# Patient Record
Sex: Female | Born: 1990 | Race: Black or African American | Hispanic: No | Marital: Single | Smoking: Never smoker
Health system: Southern US, Community
[De-identification: ages and names within clinical notes are randomized; demographics above are authoritative.]

## PROBLEM LIST (undated history)

## (undated) DIAGNOSIS — R519 Headache, unspecified: Secondary | ICD-10-CM

## (undated) DIAGNOSIS — R51 Headache: Secondary | ICD-10-CM

## (undated) DIAGNOSIS — F419 Anxiety disorder, unspecified: Secondary | ICD-10-CM

## (undated) HISTORY — DX: Headache, unspecified: R51.9

## (undated) HISTORY — PX: WISDOM TOOTH EXTRACTION: SHX21

## (undated) HISTORY — DX: Headache: R51

---
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
## 2011-09-08 ENCOUNTER — Other Ambulatory Visit: Payer: Self-pay | Admitting: Gastroenterology

## 2011-09-08 DIAGNOSIS — R1110 Vomiting, unspecified: Secondary | ICD-10-CM

## 2011-09-08 DIAGNOSIS — R109 Unspecified abdominal pain: Secondary | ICD-10-CM

## 2011-09-16 ENCOUNTER — Encounter (HOSPITAL_COMMUNITY)
Admission: RE | Admit: 2011-09-16 | Discharge: 2011-09-16 | Disposition: A | Discharge status: Home / Self Care | Payer: BC Managed Care – PPO | Source: Ambulatory Visit | Attending: Gastroenterology | Admitting: Gastroenterology

## 2011-09-16 DIAGNOSIS — R1110 Vomiting, unspecified: Secondary | ICD-10-CM | POA: Insufficient documentation

## 2011-09-16 DIAGNOSIS — R109 Unspecified abdominal pain: Secondary | ICD-10-CM | POA: Insufficient documentation

## 2011-09-16 MED ORDER — SINCALIDE 5 MCG IJ SOLR
INTRAMUSCULAR | Status: AC
  Administered 2011-09-16: 3.48 ug via INTRAVENOUS
  Filled 2011-09-16: qty 5

## 2011-09-16 MED ORDER — TECHNETIUM TC 99M MEBROFENIN IV KIT
5.0000 | PACK | Freq: Once | INTRAVENOUS | Status: AC | PRN
  Administered 2011-09-16: 5 via INTRAVENOUS

## 2011-09-16 NOTE — Progress Notes (Deleted)
Pt in for hepatobiliary scan with kinevac.   Pt's wt today:  153 LB / 69.5 KG Kinevac doasage:  3.5 mcg diluted in 250 ML NS.  100 ML infused at 200 ML per hour. Pt tolerated well

## 2011-09-16 NOTE — Progress Notes (Signed)
Pt for hepatobiliary scan with kinevac today. Wt 153 lb / 69.5 KG Kinevac dosage of 3.5 mcg added to 250 ml NS.  Infused 100 ml at rate of 200 ml per hr. Pt tolerated well.

## 2013-05-02 ENCOUNTER — Ambulatory Visit (INDEPENDENT_AMBULATORY_CARE_PROVIDER_SITE_OTHER): Payer: BC Managed Care – PPO | Admitting: Emergency Medicine

## 2013-05-02 VITALS — BP 118/72 | HR 93 | Temp 99.0°F | Resp 16 | Ht 64.5 in | Wt 162.6 lb

## 2013-05-02 DIAGNOSIS — G8929 Other chronic pain: Secondary | ICD-10-CM

## 2013-05-02 DIAGNOSIS — R1013 Epigastric pain: Secondary | ICD-10-CM

## 2013-05-02 DIAGNOSIS — M549 Dorsalgia, unspecified: Secondary | ICD-10-CM

## 2013-05-02 LAB — COMPREHENSIVE METABOLIC PANEL WITH GFR
ALT: 11 U/L (ref 0–35)
AST: 22 U/L (ref 0–37)
Albumin: 4.6 g/dL (ref 3.5–5.2)
Alkaline Phosphatase: 84 U/L (ref 39–117)
BUN: 7 mg/dL (ref 6–23)
CO2: 25 meq/L (ref 19–32)
Calcium: 10 mg/dL (ref 8.4–10.5)
Chloride: 102 meq/L (ref 96–112)
Creat: 0.59 mg/dL (ref 0.50–1.10)
Glucose, Bld: 77 mg/dL (ref 70–99)
Potassium: 4.3 meq/L (ref 3.5–5.3)
Sodium: 138 meq/L (ref 135–145)
Total Bilirubin: 0.3 mg/dL (ref 0.3–1.2)
Total Protein: 8 g/dL (ref 6.0–8.3)

## 2013-05-02 LAB — POCT URINALYSIS DIPSTICK
Bilirubin, UA: NEGATIVE
Blood, UA: NEGATIVE
Glucose, UA: NEGATIVE
Leukocytes, UA: NEGATIVE
Nitrite, UA: NEGATIVE
Protein, UA: NEGATIVE
Urobilinogen, UA: 0.2
pH, UA: 8.5

## 2013-05-02 LAB — POCT CBC
Granulocyte percent: 48.5 % (ref 37–80)
HCT, POC: 41 % (ref 37.7–47.9)
Hemoglobin: 12.8 g/dL (ref 12.2–16.2)
Lymph, poc: 3.1 (ref 0.6–3.4)
MCH, POC: 27.2 pg (ref 27–31.2)
MCHC: 31.2 g/dL — AB (ref 31.8–35.4)
MCV: 87 fL (ref 80–97)
MID (cbc): 0.6 (ref 0–0.9)
MPV: 9.4 fL (ref 0–99.8)
POC Granulocyte: 3.4 (ref 2–6.9)
POC LYMPH PERCENT: 43.7 % (ref 10–50)
POC MID %: 7.8 % (ref 0–12)
Platelet Count, POC: 229 10*3/uL (ref 142–424)
RBC: 4.71 M/uL (ref 4.04–5.48)
RDW, POC: 14.5 %
WBC: 7.1 10*3/uL (ref 4.6–10.2)

## 2013-05-02 LAB — LIPASE: Lipase: 13 U/L (ref 0–75)

## 2013-05-02 LAB — POCT UA - MICROSCOPIC ONLY
Bacteria, U Microscopic: NEGATIVE
Crystals, Ur, HPF, POC: NEGATIVE

## 2013-05-02 LAB — POCT URINE PREGNANCY: Preg Test, Ur: NEGATIVE

## 2013-05-02 LAB — AMYLASE: Amylase: 71 U/L (ref 0–105)

## 2013-05-02 MED ORDER — SUCRALFATE 1 GM/10ML PO SUSP: 1.0000 g | Freq: Four times a day (QID) | ORAL | Status: DC

## 2013-05-02 MED ORDER — OMEPRAZOLE 40 MG PO CPDR: 40.0000 mg | DELAYED_RELEASE_CAPSULE | Freq: Every day | ORAL | Status: DC

## 2013-05-02 NOTE — Patient Instructions (Addendum)
Abdominal Pain    Your appt with the GI doctor is scheduled on Oct 7th at 1:45 with Dr Loreta Ave     Abdominal pain can be caused by many things. Your caregiver decides the seriousness of your pain by an examination and possibly blood tests and X-rays. Many cases can be observed and treated at home. Most abdominal pain is not caused by a disease and will probably improve without treatment. However, in many cases, more time must pass before a clear cause of the pain can be found. Before that point, it may not be known if you need more testing, or if hospitalization or surgery is needed. HOME CARE INSTRUCTIONS   Do not take laxatives unless directed by your caregiver.  Take pain medicine only as directed by your caregiver.  Only take over-the-counter or prescription medicines for pain, discomfort, or fever as directed by your caregiver.  Try a clear liquid diet (broth, tea, or water) for as long as directed by your caregiver. Slowly move to a bland diet as tolerated. SEEK IMMEDIATE MEDICAL CARE IF:   The pain does not go away.  You have a fever.  You keep throwing up (vomiting).  The pain is felt only in portions of the abdomen. Pain in the right side could possibly be appendicitis. In an adult, pain in the left lower portion of the abdomen could be colitis or diverticulitis.  You pass bloody or black tarry stools. MAKE SURE YOU:   Understand these instructions.  Will watch your condition.  Will get help right away if you are not doing well or get worse. Document Released: 04/27/2005 Document Revised: 10/10/2011 Document Reviewed: 03/05/2008 [facility] Patient Information 2014 [place], [place].

## 2013-05-02 NOTE — Progress Notes (Signed)
Subjective:    Patient ID: Tracy Murphy, female    DOB: 1991-04-16, 22 y.o.   MRN: [id number]  Back Pain The current episode started 1 to 4 weeks ago. The problem has been gradually worsening since onset. The pain is present in the lumbar spine. The quality of the pain is described as aching and stabbing. The pain is at a severity of 8/10. The pain is moderate (back is the worst pain that radiates to the sides of her abd). The pain is the same all the time. The symptoms are aggravated by sitting. Associated symptoms include abdominal pain and headaches. Pertinent negatives include no fever. She has tried NSAIDs for the symptoms. The treatment provided mild relief.  Abdominal Pain Associated symptoms include headaches, nausea and vomiting. Pertinent negatives include no diarrhea or fever.  Emesis  Associated symptoms include abdominal pain and headaches. Pertinent negatives include no chills, diarrhea or fever.   Patient comes into our office with low back pain that's been going on for 1 week haven't been around anyone sick and haven't been out of the country No HX of back pain Last menstrual period was last month on the 15th Not sexually active, not married and no children; Student at [employer] On an antibiotic for gum infection on right side bottom gum she started this medicine last Saturday also pain medicine that she started the same day   Review of Systems  Constitutional: Positive for fatigue. Negative for fever and chills.  HENT: Negative for ear pain.   Gastrointestinal: Positive for nausea, vomiting and abdominal pain. Negative for diarrhea.       Upper abd pain  Genitourinary: Positive for urgency.  Musculoskeletal: Positive for back pain.  Neurological: Positive for headaches.       Sometimes off/on       Objective:   Physical Exam patient is alert and cooperative she is not in any distress. The clear to auscultation and percussion heart regular rate no murmurs or gallops  appreciated. Abdomen is flat there is significant mid epigastric tenderness to. bowel sounds are present.there is no rebound.   Results for orders placed in visit on 05/02/13  POCT URINALYSIS DIPSTICK      Result Value Range   Color, UA yellow     Clarity, UA clear     Glucose, UA neg     Bilirubin, UA neg     Ketones, UA neg     Spec Grav, UA 1.020     Blood, UA neg     pH, UA 8.5     Protein, UA neg     Urobilinogen, UA 0.2     Nitrite, UA neg     Leukocytes, UA Negative    POCT UA - MICROSCOPIC ONLY      Result Value Range   WBC, Ur, HPF, POC 0-4     RBC, urine, microscopic 0-3     Bacteria, U Microscopic neg     Mucus, UA neg     Epithelial cells, urine per micros 9-17     Crystals, Ur, HPF, POC neg     Casts, Ur, LPF, POC neg     Yeast, UA neg     Results for orders placed in visit on 05/02/13  POCT URINALYSIS DIPSTICK      Result Value Range   Color, UA yellow     Clarity, UA clear     Glucose, UA neg     Bilirubin, UA neg  Ketones, UA neg     Spec Grav, UA 1.020     Blood, UA neg     pH, UA 8.5     Protein, UA neg     Urobilinogen, UA 0.2     Nitrite, UA neg     Leukocytes, UA Negative    POCT UA - MICROSCOPIC ONLY      Result Value Range   WBC, Ur, HPF, POC 0-4     RBC, urine, microscopic 0-3     Bacteria, U Microscopic neg     Mucus, UA neg     Epithelial cells, urine per micros 9-17     Crystals, Ur, HPF, POC neg     Casts, Ur, LPF, POC neg     Yeast, UA neg    POCT CBC      Result Value Range   WBC 7.1  4.6 - 10.2 K/uL   Lymph, poc 3.1  0.6 - 3.4   POC LYMPH PERCENT 43.7  10 - 50 %L   MID (cbc) 0.6  0 - 0.9   POC MID % 7.8  0 - 12 %M   POC Granulocyte 3.4  2 - 6.9   Granulocyte percent 48.5  37 - 80 %G   RBC 4.71  4.04 - 5.48 M/uL   Hemoglobin 12.8  12.2 - 16.2 g/dL   HCT, POC 69.6  29.5 - 47.9 %   MCV 87.0  80 - 97 fL   MCH, POC 27.2  27 - 31.2 pg   MCHC 31.2 (*) 31.8 - 35.4 g/dL   RDW, POC 28.4     Platelet Count, POC 229  142 - 424  K/uL   MPV 9.4  0 - 99.8 fL  POCT URINE PREGNANCY      Result Value Range   Preg Test, Ur Negative         Assessment & Plan:  Appointment made with Dr. Loreta Ave. We will treat with Carafate and Prilosec prior to appointment. She was given a liter of fluids IV and encouraged during good quantities of fluid

## 2013-05-03 LAB — HELICOBACTER PYLORI  ANTIBODY, IGM: Helicobacter pylori, IgM: 4.2 U/mL (ref <9.0)

## 2013-09-02 ENCOUNTER — Ambulatory Visit (INDEPENDENT_AMBULATORY_CARE_PROVIDER_SITE_OTHER): Payer: BC Managed Care – PPO | Admitting: Family Medicine

## 2013-09-02 VITALS — BP 122/90 | HR 87 | Temp 98.0°F | Resp 16 | Ht 64.0 in | Wt 164.0 lb

## 2013-09-02 DIAGNOSIS — R112 Nausea with vomiting, unspecified: Secondary | ICD-10-CM

## 2013-09-02 DIAGNOSIS — G43119 Migraine with aura, intractable, without status migrainosus: Secondary | ICD-10-CM

## 2013-09-02 DIAGNOSIS — Z79899 Other long term (current) drug therapy: Secondary | ICD-10-CM

## 2013-09-02 MED ORDER — PROMETHAZINE HCL 25 MG PO TABS: 25.0000 mg | ORAL_TABLET | Freq: Three times a day (TID) | ORAL | Status: DC | PRN

## 2013-09-02 MED ORDER — BUTALBITAL-APAP-CAFFEINE 50-325-40 MG PO TABS: 1.0000 | ORAL_TABLET | Freq: Three times a day (TID) | ORAL | Status: DC | PRN

## 2013-09-02 MED ORDER — KETOROLAC TROMETHAMINE 60 MG/2ML IM SOLN
60.0000 mg | Freq: Once | INTRAMUSCULAR | Status: AC
  Administered 2013-09-02: 60 mg via INTRAMUSCULAR

## 2013-09-02 NOTE — Patient Instructions (Signed)
Migraine Headache A migraine headache is an intense, throbbing pain on one or both sides of your head. A migraine can last for 30 minutes to several hours. CAUSES  The exact cause of a migraine headache is not always known. However, a migraine may be caused when nerves in the brain become irritated and release chemicals that cause inflammation. This causes pain. Certain things may also trigger migraines, such as:  Alcohol.  Smoking.  Stress.  Menstruation.  Aged cheeses.  Foods or drinks that contain nitrates, glutamate, aspartame, or tyramine.  Lack of sleep.  Chocolate.  Caffeine.  Hunger.  Physical exertion.  Fatigue.  Medicines used to treat chest pain (nitroglycerine), birth control pills, estrogen, and some blood pressure medicines. SIGNS AND SYMPTOMS  Pain on one or both sides of your head.  Pulsating or throbbing pain.  Severe pain that prevents daily activities.  Pain that is aggravated by any physical activity.  Nausea, vomiting, or both.  Dizziness.  Pain with exposure to bright lights, loud noises, or activity.  General sensitivity to bright lights, loud noises, or smells. Before you get a migraine, you may get warning signs that a migraine is coming (aura). An aura may include:  Seeing flashing lights.  Seeing bright spots, halos, or zig-zag lines.  Having tunnel vision or blurred vision.  Having feelings of numbness or tingling.  Having trouble talking.  Having muscle weakness. DIAGNOSIS  A migraine headache is often diagnosed based on:  Symptoms.  Physical exam.  A CT scan or MRI of your head. These imaging tests cannot diagnose migraines, but they can help rule out other causes of headaches. TREATMENT Medicines may be given for pain and nausea. Medicines can also be given to help prevent recurrent migraines.  HOME CARE INSTRUCTIONS  Only take over-the-counter or prescription medicines for pain or discomfort as directed by your  health care provider. The use of long-term narcotics is not recommended.  Lie down in a dark, quiet room when you have a migraine.  Keep a journal to find out what may trigger your migraine headaches. For example, write down:  What you eat and drink.  How much sleep you get.  Any change to your diet or medicines.  Limit alcohol consumption.  Quit smoking if you smoke.  Get 7 9 hours of sleep, or as recommended by your health care provider.  Limit stress.  Keep lights dim if bright lights bother you and make your migraines worse. SEEK IMMEDIATE MEDICAL CARE IF:   Your migraine becomes severe.  You have a fever.  You have a stiff neck.  You have vision loss.  You have muscular weakness or loss of muscle control.  You start losing your balance or have trouble walking.  You feel faint or pass out.  You have severe symptoms that are different from your first symptoms. MAKE SURE YOU:   Understand these instructions.  Will watch your condition.  Will get help right away if you are not doing well or get worse. Document Released: 07/18/2005 Document Revised: 05/08/2013 Document Reviewed: 03/25/2013 [employer] Patient Information 2014 [employer].  
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
## 2013-09-20 ENCOUNTER — Emergency Department (HOSPITAL_COMMUNITY)
Admission: EM | Admit: 2013-09-20 | Discharge: 2013-09-20 | Disposition: A | Discharge status: Home / Self Care | Payer: BC Managed Care – PPO | Attending: Emergency Medicine | Admitting: Emergency Medicine

## 2013-09-20 ENCOUNTER — Encounter (HOSPITAL_COMMUNITY): Payer: Self-pay | Admitting: Emergency Medicine

## 2013-09-20 ENCOUNTER — Emergency Department (HOSPITAL_COMMUNITY): Payer: BC Managed Care – PPO

## 2013-09-20 DIAGNOSIS — Y9389 Activity, other specified: Secondary | ICD-10-CM | POA: Insufficient documentation

## 2013-09-20 DIAGNOSIS — Z8679 Personal history of other diseases of the circulatory system: Secondary | ICD-10-CM | POA: Insufficient documentation

## 2013-09-20 DIAGNOSIS — S5002XA Contusion of left elbow, initial encounter: Secondary | ICD-10-CM

## 2013-09-20 DIAGNOSIS — S5000XA Contusion of unspecified elbow, initial encounter: Secondary | ICD-10-CM | POA: Insufficient documentation

## 2013-09-20 DIAGNOSIS — S161XXA Strain of muscle, fascia and tendon at neck level, initial encounter: Secondary | ICD-10-CM

## 2013-09-20 DIAGNOSIS — Y92410 Unspecified street and highway as the place of occurrence of the external cause: Secondary | ICD-10-CM | POA: Insufficient documentation

## 2013-09-20 DIAGNOSIS — S139XXA Sprain of joints and ligaments of unspecified parts of neck, initial encounter: Secondary | ICD-10-CM | POA: Insufficient documentation

## 2013-09-20 MED ORDER — HYDROCODONE-ACETAMINOPHEN 5-325 MG PO TABS
1.0000 | ORAL_TABLET | Freq: Once | ORAL | Status: AC
Start: 2013-09-20 — End: 2013-09-20
  Administered 2013-09-20: 1 via ORAL
  Filled 2013-09-20: qty 1

## 2013-09-20 MED ORDER — IBUPROFEN 800 MG PO TABS
800.0000 mg | ORAL_TABLET | Freq: Once | ORAL | Status: AC
  Administered 2013-09-20: 800 mg via ORAL
  Filled 2013-09-20: qty 1

## 2013-09-20 MED ORDER — HYDROCODONE-ACETAMINOPHEN 5-325 MG PO TABS: 1.0000 | ORAL_TABLET | Freq: Four times a day (QID) | ORAL | Status: DC | PRN

## 2013-09-20 MED ORDER — IBUPROFEN 800 MG PO TABS: 800.0000 mg | ORAL_TABLET | Freq: Three times a day (TID) | ORAL | Status: DC | PRN

## 2013-09-20 NOTE — Discharge Instructions (Signed)
Return here as needed. Your x-rays were normal. Follow up with your doctor and the orthopedist provided. Ice and heat to the areas that are sore.

## 2013-09-20 NOTE — ED Provider Notes (Signed)
Medical screening examination/treatment/procedure(s) were performed by non-physician practitioner and as supervising physician I was immediately available for consultation/collaboration.  EKG Interpretation   None         Rolan BuccoMelanie Jaydan Meidinger, MD 09/20/13 1727
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
## 2013-10-11 ENCOUNTER — Ambulatory Visit (INDEPENDENT_AMBULATORY_CARE_PROVIDER_SITE_OTHER): Payer: BC Managed Care – PPO | Admitting: Physician Assistant

## 2013-10-11 VITALS — BP 104/62 | HR 87 | Temp 98.7°F | Resp 18 | Ht 65.0 in | Wt 165.0 lb

## 2013-10-11 DIAGNOSIS — G43909 Migraine, unspecified, not intractable, without status migrainosus: Secondary | ICD-10-CM | POA: Insufficient documentation

## 2013-10-11 DIAGNOSIS — M546 Pain in thoracic spine: Secondary | ICD-10-CM

## 2013-10-11 MED ORDER — CYCLOBENZAPRINE HCL 10 MG PO TABS: 10.0000 mg | ORAL_TABLET | Freq: Every day | ORAL | Status: DC

## 2013-10-11 NOTE — Patient Instructions (Signed)
Re-try the ibuprofen, 3 times each day, with food. If it still feels too strong, try breaking it in half. Take the muscle relaxer (cyclobenzaprine) at bedtime-it causes sleepiness. Apply a warm compress to the area 3 or more times each day, for 15-30 minutes. After applying the warm compress, try some gentle range of motion of the shoulder and elbow. [facility], we'll plan to get you scheduled for physical therapy.
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
## 2014-02-08 ENCOUNTER — Emergency Department (HOSPITAL_COMMUNITY): Payer: BC Managed Care – PPO

## 2014-02-08 ENCOUNTER — Emergency Department (HOSPITAL_COMMUNITY)
Admission: EM | Admit: 2014-02-08 | Discharge: 2014-02-09 | Disposition: A | Discharge status: Home / Self Care | Payer: BC Managed Care – PPO | Attending: Emergency Medicine | Admitting: Emergency Medicine

## 2014-02-08 ENCOUNTER — Encounter (HOSPITAL_COMMUNITY): Payer: Self-pay | Admitting: Emergency Medicine

## 2014-02-08 DIAGNOSIS — R062 Wheezing: Secondary | ICD-10-CM | POA: Insufficient documentation

## 2014-02-08 DIAGNOSIS — E876 Hypokalemia: Secondary | ICD-10-CM | POA: Insufficient documentation

## 2014-02-08 DIAGNOSIS — R197 Diarrhea, unspecified: Secondary | ICD-10-CM | POA: Insufficient documentation

## 2014-02-08 DIAGNOSIS — R112 Nausea with vomiting, unspecified: Secondary | ICD-10-CM | POA: Insufficient documentation

## 2014-02-08 DIAGNOSIS — Z3202 Encounter for pregnancy test, result negative: Secondary | ICD-10-CM | POA: Insufficient documentation

## 2014-02-08 DIAGNOSIS — R1031 Right lower quadrant pain: Secondary | ICD-10-CM | POA: Insufficient documentation

## 2014-02-08 DIAGNOSIS — Z79899 Other long term (current) drug therapy: Secondary | ICD-10-CM | POA: Insufficient documentation

## 2014-02-08 LAB — COMPREHENSIVE METABOLIC PANEL
ALT: 7 U/L (ref 0–35)
AST: 18 U/L (ref 0–37)
Albumin: 4.1 g/dL (ref 3.5–5.2)
Alkaline Phosphatase: 79 U/L (ref 39–117)
Anion gap: 14 (ref 5–15)
BUN: 4 mg/dL — ABNORMAL LOW (ref 6–23)
CO2: 26 meq/L (ref 19–32)
CREATININE: 0.59 mg/dL (ref 0.50–1.10)
Calcium: 9.5 mg/dL (ref 8.4–10.5)
Chloride: 93 mEq/L — ABNORMAL LOW (ref 96–112)
GLUCOSE: 93 mg/dL (ref 70–99)
Potassium: 2.8 mEq/L — CL (ref 3.7–5.3)
Sodium: 133 mEq/L — ABNORMAL LOW (ref 137–147)
TOTAL PROTEIN: 8 g/dL (ref 6.0–8.3)
Total Bilirubin: 0.3 mg/dL (ref 0.3–1.2)

## 2014-02-08 LAB — URINALYSIS, ROUTINE W REFLEX MICROSCOPIC
BILIRUBIN URINE: NEGATIVE
Glucose, UA: NEGATIVE mg/dL
Ketones, ur: NEGATIVE mg/dL
NITRITE: NEGATIVE
PH: 7 (ref 5.0–8.0)
PROTEIN: NEGATIVE mg/dL
Specific Gravity, Urine: 1 — ABNORMAL LOW (ref 1.005–1.030)
Urobilinogen, UA: 0.2 mg/dL (ref 0.0–1.0)

## 2014-02-08 LAB — MAGNESIUM: MAGNESIUM: 1.8 mg/dL (ref 1.5–2.5)

## 2014-02-08 LAB — CBC WITH DIFFERENTIAL/PLATELET
Basophils Absolute: 0 10*3/uL (ref 0.0–0.1)
Basophils Relative: 0 % (ref 0–1)
EOS PCT: 2 % (ref 0–5)
Eosinophils Absolute: 0.1 10*3/uL (ref 0.0–0.7)
HEMATOCRIT: 35.8 % — AB (ref 36.0–46.0)
HEMOGLOBIN: 12 g/dL (ref 12.0–15.0)
LYMPHS PCT: 42 % (ref 12–46)
Lymphs Abs: 2.1 10*3/uL (ref 0.7–4.0)
MCH: 27.1 pg (ref 26.0–34.0)
MCHC: 33.5 g/dL (ref 30.0–36.0)
MCV: 80.8 fL (ref 78.0–100.0)
MONO ABS: 0.5 10*3/uL (ref 0.1–1.0)
MONOS PCT: 10 % (ref 3–12)
Neutro Abs: 2.3 10*3/uL (ref 1.7–7.7)
Neutrophils Relative %: 46 % (ref 43–77)
PLATELETS: 477 10*3/uL — AB (ref 150–400)
RBC: 4.43 MIL/uL (ref 3.87–5.11)
RDW: 14.7 % (ref 11.5–15.5)
WBC: 5 10*3/uL (ref 4.0–10.5)

## 2014-02-08 LAB — URINE MICROSCOPIC-ADD ON

## 2014-02-08 LAB — POC URINE PREG, ED: Preg Test, Ur: NEGATIVE

## 2014-02-08 LAB — LIPASE, BLOOD: Lipase: 21 U/L (ref 11–59)

## 2014-02-08 MED ORDER — MORPHINE SULFATE 4 MG/ML IJ SOLN
4.0000 mg | INTRAMUSCULAR | Status: DC | PRN
  Administered 2014-02-08: 4 mg via INTRAVENOUS
  Filled 2014-02-08: qty 1

## 2014-02-08 MED ORDER — ONDANSETRON HCL 4 MG/2ML IJ SOLN
4.0000 mg | Freq: Once | INTRAMUSCULAR | Status: AC
  Administered 2014-02-08: 4 mg via INTRAVENOUS
  Filled 2014-02-08: qty 2

## 2014-02-08 MED ORDER — POTASSIUM CHLORIDE 10 MEQ/100ML IV SOLN
10.0000 meq | INTRAVENOUS | Status: AC
  Administered 2014-02-08 (×2): 10 meq via INTRAVENOUS
  Filled 2014-02-08 (×2): qty 100

## 2014-02-08 MED ORDER — SODIUM CHLORIDE 0.9 % IV BOLUS (SEPSIS)
1000.0000 mL | Freq: Once | INTRAVENOUS | Status: AC
  Administered 2014-02-08: 1000 mL via INTRAVENOUS

## 2014-02-08 MED ORDER — IOHEXOL 300 MG/ML  SOLN
100.0000 mL | Freq: Once | INTRAMUSCULAR | Status: AC | PRN
Start: 2014-02-08 — End: 2014-02-08
  Administered 2014-02-08: 100 mL via INTRAVENOUS

## 2014-02-08 MED ORDER — HYDROMORPHONE HCL PF 1 MG/ML IJ SOLN
0.5000 mg | Freq: Once | INTRAMUSCULAR | Status: AC
  Administered 2014-02-08: 0.5 mg via INTRAVENOUS
  Filled 2014-02-08: qty 1

## 2014-02-08 MED ORDER — POTASSIUM CHLORIDE CRYS ER 20 MEQ PO TBCR
40.0000 meq | EXTENDED_RELEASE_TABLET | Freq: Once | ORAL | Status: AC
  Administered 2014-02-08: 40 meq via ORAL
  Filled 2014-02-08: qty 2

## 2014-02-08 NOTE — ED Notes (Signed)
Potassium 2.8, Dr. Silverio LayYao and RN notified.

## 2014-02-08 NOTE — ED Notes (Signed)
Pt c/o severe RLQ pain since last night.  Vomited 4 times.  Rebound tenderness.

## 2014-02-08 NOTE — ED Notes (Signed)
Pelvic canceled due to patient not having sexual hx

## 2014-02-08 NOTE — ED Provider Notes (Signed)
CSN: [id number]     Arrival date & time 02/08/14  1928 History   First MD Initiated Contact with Patient 02/08/14 2004     Chief Complaint  Patient presents with  . Abdominal Pain  . Emesis     (Consider location/radiation/quality/duration/timing/severity/associated sxs/prior Treatment) HPI  Tracy Murphy is a 23 y.o. female complaining of right lower quadrant pain onset yesterday worsening severely today. Associated with diarrhea, 3 episodes of nonbloody, nonbilious, non coffee-ground emesis vomiting. Patient denies prior abdominal surgery, abnormal vaginal discharge fever, chills. States she is just starting her period. Vision is sexually active, observant Muslim.   Past Medical History  Diagnosis Date  . Headache    Past Surgical History  Procedure Laterality Date  . Wisdom tooth extraction     Family History  Problem Relation Age of Onset  . Diabetes Father   . Heart disease Father   . Hypertension Father   . Migraines Father   . Hyperlipidemia Father    History  Substance Use Topics  . Smoking status: Never Smoker   . Smokeless tobacco: Never Used  . Alcohol Use: No   OB History   Grav Para Term Preterm Abortions TAB SAB Ect Mult Living                 Review of Systems  10 systems reviewed and found to be negative, except as noted in the HPI.   Allergies  Review of patient's allergies indicates no known allergies.  Home Medications   Prior to Admission medications   Medication Sig Start Date End Date Taking? Authorizing Provider  acetaminophen (TYLENOL) 325 MG tablet Take 650 mg by mouth every 6 (six) hours as needed (pain).   Yes Historical Provider, MD  omeprazole (PRILOSEC) 40 MG capsule Take 40 mg by mouth daily.   Yes Historical Provider, MD  oxyCODONE-acetaminophen (PERCOCET/ROXICET) 5-325 MG per tablet 1 to 2 tabs PO q6hrs  PRN for pain 02/09/14   Joni Reining Dailey Alberson, PA-C  potassium chloride (K-DUR) 10 MEQ tablet Take 3 tablets (30 mEq total) by  mouth daily. 02/09/14   [place] Grosser, PA-C  promethazine (PHENERGAN) 25 MG tablet Take 1 tablet (25 mg total) by mouth every 6 (six) hours as needed for nausea or vomiting. 02/09/14   Joni Reining Bohdan Macho, PA-C   BP 109/57  Pulse 93  Temp(Src) 97.6 F (36.4 C) (Oral)  Resp 15  SpO2 99%  LMP 02/07/2014 Physical Exam  Nursing note and vitals reviewed. Constitutional: She is oriented to person, place, and time. She appears well-developed and well-nourished. No distress.  HENT:  Head: Normocephalic.  Eyes: Conjunctivae and EOM are normal.  Cardiovascular: Normal rate.   Pulmonary/Chest: Effort normal. No stridor.  Abdominal: She exhibits no distension. There is tenderness. There is rebound and guarding.  Right lower quadrant tenderness to palpation, positive wheezing, positive psoas, positive obturator.   Musculoskeletal: Normal range of motion.  Neurological: She is alert and oriented to person, place, and time.  Psychiatric: She has a normal mood and affect.    ED Course  Procedures (including critical care time) Labs Review Labs Reviewed  CBC WITH DIFFERENTIAL - Abnormal; Notable for the following:    HCT 35.8 (*)    Platelets 477 (*)    All other components within normal limits  COMPREHENSIVE METABOLIC PANEL - Abnormal; Notable for the following:    Sodium 133 (*)    Potassium 2.8 (*)    Chloride 93 (*)    BUN 4 (*)  All other components within normal limits  URINALYSIS, ROUTINE W REFLEX MICROSCOPIC - Abnormal; Notable for the following:    Specific Gravity, Urine 1.000 (*)    Hgb urine dipstick LARGE (*)    Leukocytes, UA TRACE (*)    All other components within normal limits  LIPASE, BLOOD  URINE MICROSCOPIC-ADD ON  MAGNESIUM  HIV ANTIBODY (ROUTINE TESTING)  POC URINE PREG, ED    Imaging Review [place] Pelvis Complete  02/09/2014   CLINICAL DATA:  Right lower quadrant pain.  EXAM: TRANSABDOMINAL ULTRASOUND OF PELVIS  DOPPLER ULTRASOUND OF OVARIES  TECHNIQUE:  Transabdominal ultrasound examination of the pelvis was performed including evaluation of the uterus, ovaries, adnexal regions, and pelvic cul-de-sac.  Color and duplex Doppler ultrasound was utilized to evaluate blood flow to the ovaries.  COMPARISON:  CT abdomen and pelvis 02/08/2014  FINDINGS: Uterus  Measurements: 7.5 x 2.8 x 3.5 cm, anteverted. No fibroids or other mass visualized.  Endometrium  Thickness: 3.8 mm  No focal abnormality visualized.  Right ovary  Measurements: 5 x 2.5 x 3.4 cm. Dominant simple appearing cyst demonstrated measuring 3.1 cm maximal diameter. No wall thickening, nodularity, or flow is demonstrated in the cyst. Otherwise normal follicular changes are present.  Left ovary  Measurements: 3.4 x 1.6 x 2.4 cm. Normal appearance/no adnexal mass.  Pulsed Doppler evaluation demonstrates normal low-resistance arterial and venous waveforms in both ovaries. Flow is demonstrated in both ovaries on color flow Doppler imaging.  IMPRESSION: Normal ultrasound appearance of the uterus and left ovary. Dominant cyst in the right ovary appears benign. Based on size in a premenopausal patient, no additional follow-up is indicated. No evidence of ovarian torsion.   Electronically Signed   By: Burman NievesWilliam  Stevens M.D.   On: 02/09/2014 01:17   Ct Abdomen Pelvis W Contrast  02/08/2014   CLINICAL DATA:  Right lower quadrant abdominal pain. Rebound tenderness at physical exam.  EXAM: CT ABDOMEN AND PELVIS WITH CONTRAST  TECHNIQUE: Multidetector CT imaging of the abdomen and pelvis was performed using the standard protocol following bolus administration of intravenous contrast.  CONTRAST:  100mL OMNIPAQUE IOHEXOL 300 MG/ML  SOLN  COMPARISON:  No similar prior exam is available at this institution for comparison or on [facility] PACS.  FINDINGS: Trace bilateral pleural effusions with associated compressive atelectasis noted.  Liver, gallbladder, adrenal glands, kidneys, pancreas, and spleen are normal. No  lymphadenopathy or free fluid. No free air. No radiopaque renal, ureteral, or bladder calculus.  The appendix is normal, image 60. Uterus and left ovary are normal. Probable physiologic fluid density right ovarian cyst is incidentally noted, 3.7 cm. Trace free pelvic fluid is identified. Bladder is physiologically distended but otherwise unremarkable. No bowel wall thickening or focal segmental dilatation. No acute osseous abnormality.  IMPRESSION: Probable physiologic right ovarian cyst. This could be symptomatic and could correlate with the patient's right lower quadrant abdominal pain. If there are symptoms suggesting ovarian torsion, this may be indistinguishable at CT, and pelvic ultrasound with Doppler could be helpful for further evaluation. No evidence for appendicitis or other acute intra-abdominal or pelvic pathology.   Electronically Signed   By: Christiana PellantGretchen  Green M.D.   On: 02/08/2014 21:42   [place] Art/ven Flow Abd Pelv Doppler  02/09/2014   CLINICAL DATA:  Right lower quadrant pain.  EXAM: TRANSABDOMINAL ULTRASOUND OF PELVIS  DOPPLER ULTRASOUND OF OVARIES  TECHNIQUE: Transabdominal ultrasound examination of the pelvis was performed including evaluation of the uterus, ovaries, adnexal regions, and pelvic cul-de-sac.  Color and  duplex Doppler ultrasound was utilized to evaluate blood flow to the ovaries.  COMPARISON:  CT abdomen and pelvis 02/08/2014  FINDINGS: Uterus  Measurements: 7.5 x 2.8 x 3.5 cm, anteverted. No fibroids or other mass visualized.  Endometrium  Thickness: 3.8 mm  No focal abnormality visualized.  Right ovary  Measurements: 5 x 2.5 x 3.4 cm. Dominant simple appearing cyst demonstrated measuring 3.1 cm maximal diameter. No wall thickening, nodularity, or flow is demonstrated in the cyst. Otherwise normal follicular changes are present.  Left ovary  Measurements: 3.4 x 1.6 x 2.4 cm. Normal appearance/no adnexal mass.  Pulsed Doppler evaluation demonstrates normal low-resistance arterial  and venous waveforms in both ovaries. Flow is demonstrated in both ovaries on color flow Doppler imaging.  IMPRESSION: Normal ultrasound appearance of the uterus and left ovary. Dominant cyst in the right ovary appears benign. Based on size in a premenopausal patient, no additional follow-up is indicated. No evidence of ovarian torsion.   Electronically Signed   By: Burman Nieves M.D.   On: 02/09/2014 01:17     EKG Interpretation None      MDM   Final diagnoses:  Hypokalemia  Non-intractable vomiting with nausea, vomiting of unspecified type  Right lower quadrant abdominal pain    Filed Vitals:   02/08/14 1953 02/09/14 0048  BP: 130/89 109/57  Pulse: 113 93  Temp: 98.6 F (37 C) 97.6 F (36.4 C)  TempSrc: Oral Oral  Resp: 16 15  SpO2: 100% 99%    Medications  potassium chloride 10 mEq in 100 mL IVPB (0 mEq Intravenous Stopped 02/09/14 0028)  sodium chloride 0.9 % bolus 1,000 mL (0 mLs Intravenous Stopped 02/08/14 2214)  ondansetron (ZOFRAN) injection 4 mg (4 mg Intravenous Given 02/08/14 2047)  iohexol (OMNIPAQUE) 300 MG/ML solution 100 mL (100 mLs Intravenous Contrast Given 02/08/14 2120)  HYDROmorphone (DILAUDID) injection 0.5 mg (0.5 mg Intravenous Given 02/08/14 2224)  potassium chloride SA (K-DUR,KLOR-CON) CR tablet 40 mEq (40 mEq Oral Given 02/08/14 2307)    Tracy Murphy is a 23 y.o. female presenting with severe right lower cord drug pain, hypoactive bowel sounds, guarding and rebound. Patient n.p.o. since this morning. Mildly tachycardic and afebrile. CT pelvis and blood work pending.  Blood work shows no leukocytosis. Urinalysis with large amount of hemoglobin however, patient is menstruating. She is found to be hypokalemic to 2.8. Patient will be repleted both orally and by IV. Magnesium within normal limits. CT shows normal appendix. Probable right physiologic ovarian cysts. Discussed with patient the importance of obtaining ultrasound to rule out torsion. Patient  declines transvaginal ultrasound: Is concerned about the probe breaking her hymen. She also declines pelvic exam for the same reason. Transabdominal ultrasound ordered. Peak abdominal exam shows improved voluntary guarding and tenderness. Patient's pain is improved after morphine. She is tolerating by mouth, was able to keep down potassium.  Transabdominal ultrasound shows no signs of torsion. Small right ovarian cyst is better visualized with no concerning characteristics. Patient advised of findings and return precautions were discussed.  Evaluation does not show pathology that would require ongoing emergent intervention or inpatient treatment. Pt is hemodynamically stable and mentating appropriately. Discussed findings and plan with patient/guardian, who agrees with care plan. All questions answered. Return precautions discussed and outpatient follow up given.   New Prescriptions   OXYCODONE-ACETAMINOPHEN (PERCOCET/ROXICET) 5-325 MG PER TABLET    1 to 2 tabs PO q6hrs  PRN for pain   POTASSIUM CHLORIDE (K-DUR) 10 MEQ TABLET    Take  3 tablets (30 mEq total) by mouth daily.   PROMETHAZINE (PHENERGAN) 25 MG TABLET    Take 1 tablet (25 mg total) by mouth every 6 (six) hours as needed for nausea or vomiting.         Wynetta Emery, PA-C 02/09/14 [phone]

## 2014-02-09 LAB — HIV ANTIBODY (ROUTINE TESTING W REFLEX): HIV 1&2 Ab, 4th Generation: NONREACTIVE

## 2014-02-09 MED ORDER — OXYCODONE-ACETAMINOPHEN 5-325 MG PO TABS
1.0000 | ORAL_TABLET | Freq: Once | ORAL | Status: AC
  Administered 2014-02-09: 1 via ORAL
  Filled 2014-02-09: qty 1

## 2014-02-09 MED ORDER — PROMETHAZINE HCL 25 MG PO TABS: 25.0000 mg | ORAL_TABLET | Freq: Four times a day (QID) | ORAL | Status: DC | PRN

## 2014-02-09 MED ORDER — POTASSIUM CHLORIDE ER 10 MEQ PO TBCR: 30.0000 meq | EXTENDED_RELEASE_TABLET | Freq: Every day | ORAL | Status: DC

## 2014-02-09 MED ORDER — OXYCODONE-ACETAMINOPHEN 5-325 MG PO TABS: ORAL_TABLET | ORAL | Status: DC

## 2014-02-09 NOTE — Discharge Instructions (Signed)
Take percocet for breakthrough pain, do not drink alcohol, drive, care for children or do other critical tasks while taking percocet.  Do not hesitate to return to the Emergency Department for any new, worsening or concerning symptoms.   If you do not have a primary care doctor you can establish one at the   [facility]: [address] Hypokalemia Hypokalemia means that the amount of potassium in the blood is lower than normal.Potassium is a chemical, called an electrolyte, that helps regulate the amount of fluid in the body. It also stimulates muscle contraction and helps nerves function properly.Most of the body's potassium is inside of cells, and only a very small amount is in the blood. Because the amount in the blood is so small, minor changes can be life-threatening. CAUSES  Antibiotics.  Diarrhea or vomiting.  Using laxatives too much, which can cause diarrhea.  Chronic kidney disease.  Water pills (diuretics).  Eating disorders (bulimia).  Low magnesium level.  Sweating a lot. SIGNS AND SYMPTOMS  Weakness.  Constipation.  Fatigue.  Muscle cramps.  Mental confusion.  Skipped heartbeats or irregular heartbeat (palpitations).  Tingling or numbness. DIAGNOSIS  Your health care provider can diagnose hypokalemia with blood tests. In addition to checking your potassium level, your health care provider may also check other lab tests. TREATMENT Hypokalemia can be treated with potassium supplements taken by mouth or adjustments in your current medicines. If your potassium level is very low, you may need to get potassium through a vein (IV) and be monitored in the hospital. A diet high in potassium is also helpful. Foods high in potassium are:  Nuts, such as peanuts and pistachios.  Seeds, such as sunflower seeds and pumpkin seeds.  Peas, lentils, and lima beans.  Whole grain and bran cereals and breads.  Fresh fruit and vegetables, such as apricots,  avocado, bananas, cantaloupe, kiwi, oranges, tomatoes, asparagus, and potatoes.  Orange and tomato juices.  Red meats.  Fruit yogurt. HOME CARE INSTRUCTIONS  Take all medicines as prescribed by your health care provider.  Maintain a healthy diet by including nutritious food, such as fruits, vegetables, nuts, whole grains, and lean meats.  If you are taking a laxative, be sure to follow the directions on the label. SEEK MEDICAL CARE IF:  Your weakness gets worse.  You feel your heart pounding or racing.  You are vomiting or having diarrhea.  You are diabetic and having trouble keeping your blood glucose in the normal range. SEEK IMMEDIATE MEDICAL CARE IF:  You have chest pain, shortness of breath, or dizziness.  You are vomiting or having diarrhea for more than 2 days.  You faint. MAKE SURE YOU:   Understand these instructions.  Will watch your condition.  Will get help right away if you are not doing well or get worse. Document Released: 07/18/2005 Document Revised: 05/08/2013 Document Reviewed: 01/18/2013 [facility] Patient Information 2015 [employer] [place]. This information is not intended to replace advice given to you by your health care provider. Make sure you discuss any questions you have with your health care provider.  [place] [id number] [phone]  After you establish care. Let them know you were seen in the emergency room. They must obtain records for further management.

## 2014-02-09 NOTE — ED Provider Notes (Signed)
Medical screening examination/treatment/procedure(s) were performed by non-physician practitioner and as supervising physician I was immediately available for consultation/collaboration.   EKG Interpretation None        Richardean Canalavid H Yao, MD 02/09/14 1452

## 2014-03-28 ENCOUNTER — Ambulatory Visit (INDEPENDENT_AMBULATORY_CARE_PROVIDER_SITE_OTHER): Payer: BC Managed Care – PPO

## 2014-03-28 ENCOUNTER — Ambulatory Visit (INDEPENDENT_AMBULATORY_CARE_PROVIDER_SITE_OTHER): Payer: BC Managed Care – PPO | Admitting: Family Medicine

## 2014-03-28 VITALS — BP 120/80 | HR 105 | Temp 98.1°F | Resp 16 | Ht 64.5 in | Wt 152.0 lb

## 2014-03-28 DIAGNOSIS — R112 Nausea with vomiting, unspecified: Secondary | ICD-10-CM

## 2014-03-28 DIAGNOSIS — R109 Unspecified abdominal pain: Secondary | ICD-10-CM

## 2014-03-28 DIAGNOSIS — K3189 Other diseases of stomach and duodenum: Secondary | ICD-10-CM

## 2014-03-28 DIAGNOSIS — E876 Hypokalemia: Secondary | ICD-10-CM

## 2014-03-28 DIAGNOSIS — R634 Abnormal weight loss: Secondary | ICD-10-CM

## 2014-03-28 DIAGNOSIS — R1013 Epigastric pain: Secondary | ICD-10-CM

## 2014-03-28 DIAGNOSIS — F502 Bulimia nervosa: Secondary | ICD-10-CM

## 2014-03-28 LAB — POCT CBC
Granulocyte percent: 53.3 %G (ref 37–80)
HEMATOCRIT: 39.8 % (ref 37.7–47.9)
Hemoglobin: 12.3 g/dL (ref 12.2–16.2)
LYMPH, POC: 2.3 (ref 0.6–3.4)
MCH, POC: 27.4 pg (ref 27–31.2)
MCHC: 31 g/dL — AB (ref 31.8–35.4)
MCV: 88.1 fL (ref 80–97)
MID (cbc): 0.5 (ref 0–0.9)
MPV: 8.5 fL (ref 0–99.8)
POC Granulocyte: 3.2 (ref 2–6.9)
POC LYMPH PERCENT: 38.7 %L (ref 10–50)
POC MID %: 8 %M (ref 0–12)
Platelet Count, POC: 278 10*3/uL (ref 142–424)
RBC: 4.51 M/uL (ref 4.04–5.48)
RDW, POC: 16.6 %
WBC: 6 10*3/uL (ref 4.6–10.2)

## 2014-03-28 LAB — POCT URINALYSIS DIPSTICK
Bilirubin, UA: NEGATIVE
Glucose, UA: NEGATIVE
KETONES UA: NEGATIVE
Nitrite, UA: NEGATIVE
PH UA: 7
PROTEIN UA: NEGATIVE
RBC UA: NEGATIVE
Spec Grav, UA: 1.01
Urobilinogen, UA: 0.2

## 2014-03-28 LAB — POCT UA - MICROSCOPIC ONLY
CRYSTALS, UR, HPF, POC: NEGATIVE
Casts, Ur, LPF, POC: NEGATIVE
MUCUS UA: NEGATIVE
YEAST UA: NEGATIVE

## 2014-03-28 MED ORDER — HYDROCODONE-ACETAMINOPHEN 5-325 MG PO TABS: 1.0000 | ORAL_TABLET | Freq: Four times a day (QID) | ORAL | Status: DC | PRN

## 2014-03-28 MED ORDER — METOCLOPRAMIDE HCL 5 MG PO TABS: ORAL_TABLET | ORAL | Status: DC

## 2014-03-28 MED ORDER — OMEPRAZOLE 40 MG PO CPDR: 40.0000 mg | DELAYED_RELEASE_CAPSULE | Freq: Every day | ORAL | Status: DC

## 2014-03-28 NOTE — Patient Instructions (Signed)
Take the metoclopramide before breakfast and before supper. If you're doing well you do not have to take it. It is to try and help ease the nausea.  I am going ahead and make a referral to a psychiatrist if we can for evaluation regarding emotional causes such as bulimia for your chronic vomiting  Referral is being made to gastroenterology to evaluate your stomach  Continue taking the omeprazole one daily  Only used the pain pills for bad pain. If back pain persists go to the emergency room or come to checked  Go to the lab and get blood drawn to check it out for the low potassium  Return to see me in about 3 or 4 weeks.  Take MiraLax until your stools are a little looser.

## 2014-03-28 NOTE — Progress Notes (Signed)
Subjective: 23 year old lady who is here with right abdominal pain. She has had these pains intermittently for some time. She has been evaluated here and in the emergency room. Ultrasound and CT scan have revealed a right ovarian cyst, and she is scheduled to see an OB/GYN next week. She has a history of some constipation. She has had nausea and vomiting for a long time. She has lost weight this spring and summer, about 13 pounds since March according to her chart. She just doesn't feel good. No dysuria. She has not sexually involved, is virginal. 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 Does not smoke or drink or use substances. Lives at home with her parents. Denies any history of abuse. She did have hypokalemia when in the emergency room  Objective: Well spoken young lady in no acute distress. Neck supple. Chest clear. Heart regular without murmur. Abdomen has bowel sounds present. Soft but generally tender. Especially tender all the way down the right side of the abdomen for laterally from the right upper to right lower quadrant areas but more around with the flank.  Assessment: Abdominal pain Nausea and vomiting Chronic constipation Weight loss  Plan: Check labs and plain x-rays of the abdomen  [facility] reading (PRIMARY) by  Dr. Alwyn Ren Normal exam.  Stool present in right colon in area of pain.  Results for orders placed in visit on 03/28/14  POCT CBC      Result Value Ref Range   WBC 6.0  4.6 - 10.2 K/uL   Lymph, poc 2.3  0.6 - 3.4   POC LYMPH PERCENT 38.7  10 - 50 %L   MID (cbc) 0.5  0 - 0.9   POC MID % 8.0  0 - 12 %M   POC Granulocyte 3.2  2 - 6.9   Granulocyte percent 53.3  37 - 80 %G   RBC 4.51  4.04 - 5.48 M/uL   Hemoglobin 12.3  12.2 - 16.2 g/dL   HCT, POC 16.1  09.6 - 47.9 %   MCV 88.1  80 - 97 fL   MCH, POC 27.4  27 - 31.2 pg   MCHC 31.0 (*) 31.8 - 35.4 g/dL   RDW, POC 04.5     Platelet Count, POC 278  142 - 424 K/uL   MPV 8.5  0 - 99.8 fL    .

## 2014-06-13 ENCOUNTER — Emergency Department (HOSPITAL_COMMUNITY)
Admission: EM | Admit: 2014-06-13 | Discharge: 2014-06-13 | Disposition: A | Discharge status: Home / Self Care | Payer: BC Managed Care – PPO | Attending: Emergency Medicine | Admitting: Emergency Medicine

## 2014-06-13 ENCOUNTER — Encounter (HOSPITAL_COMMUNITY): Payer: Self-pay | Admitting: Emergency Medicine

## 2014-06-13 ENCOUNTER — Emergency Department (HOSPITAL_COMMUNITY): Payer: BC Managed Care – PPO

## 2014-06-13 DIAGNOSIS — R51 Headache: Secondary | ICD-10-CM | POA: Diagnosis present

## 2014-06-13 DIAGNOSIS — Z79899 Other long term (current) drug therapy: Secondary | ICD-10-CM | POA: Insufficient documentation

## 2014-06-13 DIAGNOSIS — R251 Tremor, unspecified: Secondary | ICD-10-CM | POA: Diagnosis not present

## 2014-06-13 DIAGNOSIS — R519 Headache, unspecified: Secondary | ICD-10-CM

## 2014-06-13 LAB — CBC
HCT: 37.3 % (ref 36.0–46.0)
Hemoglobin: 12 g/dL (ref 12.0–15.0)
MCH: 27.5 pg (ref 26.0–34.0)
MCHC: 32.2 g/dL (ref 30.0–36.0)
MCV: 85.4 fL (ref 78.0–100.0)
PLATELETS: 476 10*3/uL — AB (ref 150–400)
RBC: 4.37 MIL/uL (ref 3.87–5.11)
RDW: 13.8 % (ref 11.5–15.5)
WBC: 5.4 10*3/uL (ref 4.0–10.5)

## 2014-06-13 LAB — COMPREHENSIVE METABOLIC PANEL
ALT: 9 U/L (ref 0–35)
AST: 19 U/L (ref 0–37)
Albumin: 3.7 g/dL (ref 3.5–5.2)
Alkaline Phosphatase: 96 U/L (ref 39–117)
Anion gap: 14 (ref 5–15)
BUN: 4 mg/dL — ABNORMAL LOW (ref 6–23)
CO2: 23 meq/L (ref 19–32)
Calcium: 9.9 mg/dL (ref 8.4–10.5)
Chloride: 105 mEq/L (ref 96–112)
Creatinine, Ser: 0.65 mg/dL (ref 0.50–1.10)
GFR calc Af Amer: >90 mL/min (ref >90)
GLUCOSE: 96 mg/dL (ref 70–99)
POTASSIUM: 4.4 meq/L (ref 3.7–5.3)
SODIUM: 142 meq/L (ref 137–147)
Total Bilirubin: <0.2 mg/dL — ABNORMAL LOW (ref 0.3–1.2)
Total Protein: 8.2 g/dL (ref 6.0–8.3)

## 2014-06-13 LAB — URINE MICROSCOPIC-ADD ON

## 2014-06-13 LAB — URINALYSIS, ROUTINE W REFLEX MICROSCOPIC
BILIRUBIN URINE: NEGATIVE
Glucose, UA: NEGATIVE mg/dL
Hgb urine dipstick: NEGATIVE
KETONES UR: NEGATIVE mg/dL
NITRITE: NEGATIVE
PH: 7.5 (ref 5.0–8.0)
Protein, ur: NEGATIVE mg/dL
SPECIFIC GRAVITY, URINE: 1.005 (ref 1.005–1.030)
UROBILINOGEN UA: 0.2 mg/dL (ref 0.0–1.0)

## 2014-06-13 LAB — I-STAT CHEM 8, ED
BUN: <3 mg/dL — ABNORMAL LOW (ref 6–23)
CALCIUM ION: 1.2 mmol/L (ref 1.12–1.23)
Chloride: 80 mEq/L — ABNORMAL LOW (ref 96–112)
Creatinine, Ser: 0.7 mg/dL (ref 0.50–1.10)
GLUCOSE: 100 mg/dL — AB (ref 70–99)
HEMATOCRIT: 41 % (ref 36.0–46.0)
Hemoglobin: 13.9 g/dL (ref 12.0–15.0)
Potassium: 4.1 mEq/L (ref 3.7–5.3)
Sodium: 140 mEq/L (ref 137–147)
TCO2: 22 mmol/L (ref 0–100)

## 2014-06-13 LAB — DIFFERENTIAL
BASOS ABS: 0 10*3/uL (ref 0.0–0.1)
Basophils Relative: 0 % (ref 0–1)
Eosinophils Absolute: 0.2 10*3/uL (ref 0.0–0.7)
Eosinophils Relative: 4 % (ref 0–5)
LYMPHS ABS: 1.7 10*3/uL (ref 0.7–4.0)
LYMPHS PCT: 33 % (ref 12–46)
Monocytes Absolute: 0.5 10*3/uL (ref 0.1–1.0)
Monocytes Relative: 9 % (ref 3–12)
Neutro Abs: 2.9 10*3/uL (ref 1.7–7.7)
Neutrophils Relative %: 54 % (ref 43–77)

## 2014-06-13 LAB — APTT: aPTT: 32 seconds (ref 24–37)

## 2014-06-13 LAB — SALICYLATE LEVEL

## 2014-06-13 LAB — RAPID URINE DRUG SCREEN, HOSP PERFORMED
Amphetamines: NOT DETECTED
BARBITURATES: NOT DETECTED
BENZODIAZEPINES: NOT DETECTED
COCAINE: NOT DETECTED
Opiates: NOT DETECTED
TETRAHYDROCANNABINOL: NOT DETECTED

## 2014-06-13 LAB — PROTIME-INR
INR: 1.03 (ref 0.00–1.49)
Prothrombin Time: 13.6 seconds (ref 11.6–15.2)

## 2014-06-13 LAB — ACETAMINOPHEN LEVEL: Acetaminophen (Tylenol), Serum: <15 ug/mL (ref 10–30)

## 2014-06-13 LAB — I-STAT TROPONIN, ED: Troponin i, poc: 0 ng/mL (ref 0.00–0.08)

## 2014-06-13 LAB — ETHANOL: Alcohol, Ethyl (B): <11 mg/dL (ref 0–11)

## 2014-06-13 MED ORDER — DIPHENHYDRAMINE HCL 50 MG/ML IJ SOLN
INTRAMUSCULAR | Status: AC
  Administered 2014-06-13: 25 mg
  Filled 2014-06-13: qty 1

## 2014-06-13 MED ORDER — DIPHENHYDRAMINE HCL 50 MG/ML IJ SOLN: 25.0000 mg | Freq: Once | INTRAMUSCULAR | Status: DC

## 2014-06-13 MED ORDER — DIPHENHYDRAMINE HCL 50 MG/ML IJ SOLN
25.0000 mg | Freq: Once | INTRAMUSCULAR | Status: AC
  Administered 2014-06-13: 25 mg via INTRAVENOUS
  Filled 2014-06-13: qty 1

## 2014-06-13 MED ORDER — METOCLOPRAMIDE HCL 5 MG/ML IJ SOLN
10.0000 mg | Freq: Once | INTRAMUSCULAR | Status: AC
Start: 2014-06-13 — End: 2014-06-13
  Administered 2014-06-13: 10 mg via INTRAVENOUS
  Filled 2014-06-13: qty 2

## 2014-06-13 MED ORDER — SODIUM CHLORIDE 0.9 % IV BOLUS (SEPSIS)
1000.0000 mL | Freq: Once | INTRAVENOUS | Status: AC
  Administered 2014-06-13: 1000 mL via INTRAVENOUS

## 2014-06-13 NOTE — Discharge Instructions (Signed)
Return to the emergency room with worsening of symptoms, new symptoms or with symptoms that are concerning, especially severe worsening of headache, visual or speech changes, weakness in face, arms or legs. Ibuprofen 400mg  (2 tablets 200mg ) every 5-6 hours for 3-5 days and then as needed for pain. Follow up with neurology for your headache. Call number above for appointment. Also follow up with the wellness center for a primary doctor or uses the below resources to establish care with primary care provider.   Emergency Department Resource Guide 1) Find a Doctor and Pay Out of Pocket Although you won't have to find out who is covered by your insurance plan, it is a good idea to ask around and get recommendations. You will then need to call the office and see if the doctor you have chosen will accept you as a new patient and what types of options they offer for patients who are self-pay. Some doctors offer discounts or will set up payment plans for their patients who do not have insurance, but you will need to ask so you aren't surprised when you get to your appointment.  2) Contact Your Local Health Department Not all health departments have doctors that can see patients for sick visits, but many do, so it is worth a call to see if yours does. If you don't know where your local health department is, you can check in your phone book. The CDC also has a tool to help you locate your state's health department, and many state websites also have listings of all of their local health departments.  3) Find a Walk-in Clinic If your illness is not likely to be very severe or complicated, you may want to try a walk in clinic. These are popping up all over the country in pharmacies, drugstores, and shopping centers. They're usually staffed by nurse practitioners or physician assistants that have been trained to treat common illnesses and complaints. They're usually fairly quick and inexpensive. However, if you have  serious medical issues or chronic medical problems, these are probably not your best option.  No Primary Care Doctor: - Call Health Connect at  [phone] - they can help you locate a primary care doctor that  accepts your insurance, provides certain services, etc. - Physician Referral Service- [phone]  Chronic Pain Problems: Organization         Address  Phone   Notes  [facility]  [phone]) [phone] Patients need to be referred by their primary care doctor.   Medication Assistance: Organization         Address  Phone   Notes  [facility] [address] [place], [place] [address] [phone] --Must be a resident of [place] -- Must have NO insurance coverage whatsoever (no Medicaid/ Medicare, etc.) -- The pt. MUST have a primary care doctor that directs their care regularly and follows them in the community   [facility]ist  [phone]   [employer]  [phone]) [phone]    Agencies that provide inexpensive medical care: Organization         Address  Phone   Notes  [facility]  [phone](336) [phone]   [facility]    [phone]   [facility] [address] [place], [place] [address] [phone]) [phone]   [facility] [place] [address], [place] [phone]   [facility]    [phone]) [phone]  [facility]    ([phone]   °[facility] ° [address], [place] Phone:  ([phone], Fax:  [phone] Hours of Operation:  9 am - 6 pm, M-F.  Also accepts Medicaid/Medicare and self-pay.  °[facility] ° [address], [place] Phone: [phone], Fax: [phone]. Hours of Operation:  8:30 am - 5:30 pm, M-F.  Also accepts Medicaid and self-pay.  °[facility] [place] [address], [place] Phone: ([phone]) [phone]   °[facility] [address],  [place], [place] (336)[phone], Ext. 123 Mondays & Thursdays: 7-9 AM.  First 15 patients are seen on a first come, first serve basis. °  ° °Medicaid-accepting [place] Providers: ° °Organization         Address  Phone   Notes  °[facility] [address], [place] ([phone]) [phone] Also accepts self-pay patients.  °[facility] [address], [place] ° ([phone]) [phone]   °[facility] [address], [place] ([phone]) [phone]   °[facility] [address] [place] [address], [place] ([phone]) [phone]   °Veita Bland [address], [place]  ° ([phone]) [phone] Only accepts [place] Access Medicaid patients after they have their name applied to their card.  ° °Self-Pay (no insurance) in [place]: ° °Organization         Address  Phone   Notes  °Sickle Cell Patients, [facility] [address], [place] (336) [phone]   °[facility] [address], [place] (336) [phone]   °[facility] ° [address], [place] (336) [phone]   °[facility]/Dr. Osei-Bonsu ° [address] [place] [address], [place] or [address], [place] ([phone]) [phone] Phone number for both [place] and [place] locations is the same.  °[facility] [address], [place] ([phone]) [phone]   °[facility] [address] [place] [address], [place] or [address] (336) [phone] °(336) [phone]   °[facility] [address],  (336) [phone], phone; ([phone]) [phone], fax Sees patients 1st and 3rd Saturday of every month.  Must not qualify for public or private insurance (i.e. Medicaid, Medicare, [place] Health Choice, Veterans' Benefits) • Household income should be no more than 200% of the poverty level •The clinic cannot treat you if you are pregnant or think you are pregnant •  Sexually transmitted diseases are not treated at the clinic.  ° ° °Dental Care: °Organization         Address  Phone  Notes  °[place] [facility] [address],  (336) [phone] Accepts children up to age 21 who are enrolled in Medicaid or [place] Health Choice; pregnant women with a Medicaid card; and children who have applied for Medicaid or [place] Health Choice, but were declined, whose parents can pay a reduced fee at time of service.  °[place] [facility] [place]  [address], [place] (336) [phone] Accepts children up to age 21 who are enrolled in Medicaid or [place] Health Choice; pregnant women with a Medicaid card; and children who have applied for Medicaid or [place] Health Choice, but were declined, whose parents can pay a reduced fee at time   of service.  °[facility] ° [address], [place] (336) [phone] Patients are seen by appointment only. Walk-ins are not accepted. [facility] will see patients 18 years of age and older. °Monday - Tuesday (8am-5pm) °Most Wednesdays (8:30-5pm) °$30 per visit, cash only  °[facility] ° [address], [place] ([phone]) [phone] Patients are seen by appointment only. Walk-ins are not accepted. [facility] will see patients 18 years of age and older. °One Wednesday Evening (Monthly: Volunteer Based).  $30 per visit, cash only  °[facility]  ([phone]) [phone] for adults; Children under age 4, call Graduate Pediatric Dentistry at ([phone]. Children aged 4-14, please call [phone] to request a pediatric application. ° Dental services are provided in all areas of dental care including fillings, crowns and bridges, complete and partial dentures, implants, gum treatment, root canals, and extractions. Preventive care is also provided. Treatment is provided to both adults and children. °Patients  are selected via a lottery and there is often a waiting list. °  °[facility] [address], °[place] ° (336) 763-8833 [email or web address] °  °[facility] [address], [place], [place] (336)[phone], Ext. 123 Second and Fourth Thursday of each month, opens at 6:30 AM; Clinic ends at 9 AM.  Patients are seen on a first-come first-served basis, and a limited number are seen during each clinic.  ° °[facility] ° [address], [place], [place] (336) [phone]   Eligibility Requirements °You must have lived in [place], [place], or [place] counties for at least the last three months. °  You cannot be eligible for state or federal sponsored healthcare insurance, including Veterans Administration, Medicaid, or Medicare. °  You generally cannot be eligible for healthcare insurance through your employer.  °  How to apply: °Eligibility screenings are held every Tuesday and Wednesday afternoon from 1:00 pm until 4:00 pm. You do not need an appointment for the interview!  °[facility] [address], [place], [place] [phone]   °[facility]  [phone]   °[place]  336-703-3100   °[facility]  336-570-6415   ° °Behavioral Health Resources in the Community: °Intensive Outpatient Programs °Organization         Address  Phone  Notes  °[facility] [address], [place], [place] [phone]   °[facility] [address], [place], [place] [phone]   °[facility] [address], [place], [place] ° 336-882-2125   °[facility] [address],  °[place], [place] 1-800-853-5163 or 336-641-4981   °Substance Abuse Resources °Organization         Address  Phone  Notes  °[facility]  336-882-2125   °[facility]  336-784-9470   °[facility]  336-285-9073   °[facility]  336-845-3988    °Residential & Outpatient Substance Abuse Program  1-800-659-3381   °Psychological Services °Organization         Address  Phone  Notes  °[facility]  336- 832-9600   °[facility]  336- 378-7881   °[facility] [address], [place] 1-800-853-5163 or 336-641-4981   ° °Mobile Crisis Teams °Organization         Address  Phone  Notes  °[facility], Mobile Crisis Care Unit  1-877-626-1772   °Assertive °Psychotherapeutic   Services  [address] [place], [place] [phone]   [facility] [address] [place] [phone]    Self-Help/Support Groups Organization         Address  Phone             Notes  [facility] [place] - variety of support groups  336- [phone] Call for more information  Narcotics Anonymous (NA), [facility] [address], [place]  2 meetings at this location   [facility]ian[facility]idential Treatment Programs Organization         Address  Phone  Notes  [facility] [address],    [place]  [phone]   [facility]  [address], [place] [phone], [place], [place] [phone]   [facility] [address], [place] [phone] Admissions: 8am-3pm M-F  [facility] [address]    [place], [place] [phone]   [facility] [address], [place], [place] [phone]   [facility] [address]  [place], [place] [phone]   [facility] - Intensive Outpatient [address], [place], [place] [phone]   [facility] ([facility] [address]  [place], [place] [phone]) [phone] or [phone]   [facility] ([facility]) [address] [place], [place] [phone] Accepts Medicaid  [facility] [address]  [place] [phone] Substance Abuse/Addiction Treatment   [facility]  Resources Organization         Address  Phone  Notes  [facility]  [phone](888) [phone]   Angie FavaJulie Brannon, PhD [address], [address] [place], [place]   [phone](336) [phone] or [phone](336) [phone]   [facility]   [address] [place], [place] [phone](336) [phone]   [facility] [address], [place], [place] [phone](336) [phone] Insurance/Medicaid/sponsorship through [facility]  [facility] [address]                                    [place], [place] [phone](336) [phone] Therapy/tele-psych/case  [facility] [address]   [place], [place] [phone](336) [phone]    Dr. Lolly MustacheArfeen  [phone](336) [phone]   [facility] [place]  [employer] [facility] 1) [address], [place] 2) [address], [place] 3)  [address], [place] [phone](336) [phone]  [phone]([phone]   [facility] Child Abuse Hotline [phone](336) [phone] or [phone](336) [phone] (After Hours)

## 2014-06-13 NOTE — ED Notes (Signed)
Patient having reaction to Reglan Verbal order for Benadryl 25 mg IV received by Dr. Effie ShyWentz Patient medicated

## 2014-06-13 NOTE — ED Notes (Addendum)
Pt reports right sided head pressure AND shaking and weakness to right side of body since 2000 last night. Upon assessment pt reports touch stronger on left side and unable to hold right extremities up for period of time. Pt reports was seen in July for hypokalemia.

## 2014-06-13 NOTE — ED Notes (Signed)
J Hall from main lab able to draw istat but unable to collect other labs.

## 2014-06-13 NOTE — ED Notes (Addendum)
Pt reports a shaking sensation and weakness that started last night at 2000 on her entire right side that and has noticeable shaking to right hand. Also reports "pressure" on the right side of her head last and today that start about the same time. Pt denies LOC says she has felt confused the past 2 days. Pt ambulatory. NAD.

## 2014-06-13 NOTE — ED Provider Notes (Signed)
CSN: [id number]     Arrival date & time 06/13/14  1141 History   First MD Initiated Contact with Patient 06/13/14 1202     No chief complaint on file.    (Consider location/radiation/quality/duration/timing/severity/associated sxs/prior Treatment) HPI  TRITIA ENDO is a 23 y.o. female without significant PMH presenting with shaking of right arm with associated weakness and heaviness since 2000 last night. Pt with history of shaking of bilateral arms for years and pt notes it is increased with stress. Patient denies numbness, tingling or injuries. Patient also with left sided throbbing/pressure headache that last night as well with gradual worsening. Pt like typical headaches with increased intensity. Pt denies blurred vision, slurred speech, nausea, vomiting, head injury or LOC. No fevers, chills. No neck stiffness. No abdominal pain, chest pain SOB or back pain. Pt presented July, 2015 with abdominal pain and found to have hypokalemia to 2.8. Given IV potassium and oral potassium with script for potassium. Pt denies cardiac history.    Past Medical History  Diagnosis Date  . Headache    Past Surgical History  Procedure Laterality Date  . Wisdom tooth extraction     Family History  Problem Relation Age of Onset  . Diabetes Father   . Heart disease Father   . Hypertension Father   . Migraines Father   . Hyperlipidemia Father    History  Substance Use Topics  . Smoking status: Never Smoker   . Smokeless tobacco: Never Used  . Alcohol Use: No   OB History    No data available     Review of Systems  Constitutional: Negative for fever and chills.  HENT: Negative for congestion and rhinorrhea.   Eyes: Negative for visual disturbance.  Respiratory: Negative for cough and shortness of breath.   Cardiovascular: Negative for chest pain and palpitations.  Gastrointestinal: Negative for nausea, vomiting and diarrhea.  Musculoskeletal: Negative for back pain and gait problem.   Skin: Negative for rash.  Neurological: Positive for headaches.      Allergies  Reglan  Home Medications   Prior to Admission medications   Medication Sig Start Date End Date Taking? Authorizing Provider  acetaminophen (TYLENOL) 325 MG tablet Take 650 mg by mouth every 6 (six) hours as needed (pain).   Yes Historical Provider, MD  HYDROcodone-acetaminophen (NORCO/VICODIN) 5-325 MG per tablet Take 1 tablet by mouth every 6 (six) hours as needed for moderate pain. 03/28/14  Yes Peyton Najjar, MD  omeprazole (PRILOSEC) 40 MG capsule Take 1 capsule (40 mg total) by mouth daily. 03/28/14  Yes Peyton Najjar, MD  promethazine (PHENERGAN) 25 MG tablet Take 1 tablet (25 mg total) by mouth every 6 (six) hours as needed for nausea or vomiting. 02/09/14  Yes Joni Reining Pisciotta, PA-C  metoCLOPramide (REGLAN) 5 MG tablet Take one twice daily before breakfast and before supper for nausea 03/28/14   Peyton Najjar, MD  oxyCODONE-acetaminophen (PERCOCET/ROXICET) 5-325 MG per tablet 1 to 2 tabs PO q6hrs  PRN for pain 02/09/14   Joni Reining Pisciotta, PA-C  potassium chloride (K-DUR) 10 MEQ tablet Take 3 tablets (30 mEq total) by mouth daily. 02/09/14   Nicole Pisciotta, PA-C   BP 108/59 mmHg  Pulse 97  Temp(Src) 98.8 F (37.1 C) (Oral)  Resp 17  SpO2 100%  LMP 05/29/2014 (Approximate) Physical Exam  Constitutional: She appears well-developed and well-nourished. No distress.  HENT:  Head: Normocephalic and atraumatic.  Mouth/Throat: Oropharynx is clear and moist.  Eyes: Conjunctivae and EOM are  normal. Pupils are equal, round, and reactive to light. Right eye exhibits no discharge. Left eye exhibits no discharge.  Neck: Normal range of motion. Neck supple.  No nuchal rigidity  Cardiovascular: Normal rate and regular rhythm.   Pulmonary/Chest: Effort normal and breath sounds normal. No respiratory distress. She has no wheezes.  Abdominal: Soft. Bowel sounds are normal. She exhibits no distension. There is  no tenderness.  Neurological: She is alert. No cranial nerve deficit. Coordination normal.  Speech is clear and goal oriented. Peripheral visual fields intact. Strength 5/5 in upper and lower extremities. Sensation intact. Intact rapid alternating movements, finger to nose, and heel to shin. Negative Romberg. No pronator drift.  Pt with bilateral drift but equal, bilaterally and pt corrects. Normal gait.   Skin: Skin is warm and dry. She is not diaphoretic.  Nursing note and vitals reviewed.   ED Course  Procedures (including critical care time) Labs Review Labs Reviewed  CBC - Abnormal; Notable for the following:    Platelets 476 (*)    All other components within normal limits  COMPREHENSIVE METABOLIC PANEL - Abnormal; Notable for the following:    BUN 4 (*)    Total Bilirubin <0.2 (*)    All other components within normal limits  URINALYSIS, ROUTINE W REFLEX MICROSCOPIC - Abnormal; Notable for the following:    APPearance CLOUDY (*)    Leukocytes, UA LARGE (*)    All other components within normal limits  URINE MICROSCOPIC-ADD ON - Abnormal; Notable for the following:    Squamous Epithelial / LPF MANY (*)    Bacteria, UA MANY (*)    All other components within normal limits  I-STAT CHEM 8, ED - Abnormal; Notable for the following:    Chloride 80 (*)    BUN <3 (*)    Glucose, Bld 100 (*)    All other components within normal limits  ETHANOL  PROTIME-INR  APTT  DIFFERENTIAL  URINE RAPID DRUG SCREEN (HOSP PERFORMED)  OSMOLALITY  ACETAMINOPHEN LEVEL  SALICYLATE LEVEL  I-STAT TROPOININ, ED  I-STAT TROPOININ, ED  I-STAT CHEM 8, ED    Imaging Review Ct Head Wo Contrast  06/13/2014   CLINICAL DATA:  Right-sided headache since 2000 hr last night  EXAM: CT HEAD WITHOUT CONTRAST  TECHNIQUE: Contiguous axial images were obtained from the base of the skull through the vertex without intravenous contrast.  COMPARISON:  No similar prior exam is available at this institution for  comparison or on [facility] PACS.  FINDINGS: No acute hemorrhage, infarct, or mass lesion is identified. No midline shift. Ventricles are normal in size. Orbits and paranasal sinuses are unremarkable. No skull fracture.  IMPRESSION: No acute intracranial finding.   Electronically Signed   By: Christiana PellantGretchen  Green M.D.   On: 06/13/2014 14:08     EKG Interpretation None      MDM   Final diagnoses:  Right-sided headache   Pt with headache that is typical for her but with increased intensity, gradual in onset and not worse of life and associated with shaking of right arm. No visual or speech changes, no N/V, and no weakness. Nursing staff noted decreased strength on right which I did not appreciate. Normal neurological exam without deficits. Pt without CT scan or neurology evaluation for her headaches. Will get labs, CT non contrast.  Patient given Reglan and felt restless with an elevated pulse of 130. She was given IV Benadryl which improved her symptoms. Pt found to have Cl 80 on istat  and anion gap 43. On CMP collected at same time without abnormalities and normal calculated anion gap. Pt also with asymptomatic bacteruria. Will not treat. Likely contaminant. Pt osmolality pending. Lab ordered due to anion gap elevation that was error. Do not need to wait for result before discharge.  Non reevaluation patient afebrile without neurological deficits and notes headache is minimal. Pt is afebrile with no focal neuro deficits or nuchal rigidity. I doubt SAH, ICH, meningits. Pt is to follow up with neurology. Patient without a PCP. Patient to establish care and follow up. ED resources provided. Pt appears reliable for follow up and is agreeable to discharge.   Discussed return precautions with patient. Discussed all results and patient verbalizes understanding and agrees with plan.  This is a shared patient. This patient was discussed with the physician, Dr. Jodi MourningZavitz who saw and evaluated the patient and agrees  with the plan.       Louann SjogrenVictoria L Eunice Oldaker, PA-C 06/13/14 [address]1604  Khara Renaud L Lashanda Storlie, PA-C 06/13/14 1605  Enid SkeensJoshua M Zavitz, MD 06/13/14 [phone]

## 2014-06-13 NOTE — ED Notes (Signed)
Two unsuccessful IV attempts by Shawn StallAshley Lassiter. Zavitz MD reports will attempt IV US.

## 2014-06-13 NOTE — ED Notes (Signed)
PA aware of elevated levels on I stat Chem 8

## 2014-06-13 NOTE — ED Notes (Signed)
Pt ambulatory earlier to provide urine sample. Pt encouraged to elevate extremities for neuro assessment. Pt able to complete an unremarkable neuro assessment when asked "how can you walk without able to lift right side?"

## 2014-06-13 NOTE — ED Notes (Signed)
Two unsuccessful IV attempts. Shawn StallAshley Lassiter at bedside attempting IV insertion at present time. PA at bedside.

## 2014-06-13 NOTE — ED Notes (Signed)
Per Caryn BeeKevin pt reports does not feel right and warm all over with reglan given.

## 2014-06-13 NOTE — ED Notes (Signed)
Main lab phelbotomy reports will come to draw additional lab orders.

## 2014-06-14 LAB — OSMOLALITY: OSMOLALITY: 293 mosm/kg (ref 275–300)

## 2014-12-05 ENCOUNTER — Ambulatory Visit (INDEPENDENT_AMBULATORY_CARE_PROVIDER_SITE_OTHER): Payer: BLUE CROSS/BLUE SHIELD

## 2014-12-05 ENCOUNTER — Ambulatory Visit (INDEPENDENT_AMBULATORY_CARE_PROVIDER_SITE_OTHER): Payer: BLUE CROSS/BLUE SHIELD | Admitting: Family Medicine

## 2014-12-05 VITALS — BP 120/80 | HR 87 | Temp 98.0°F | Resp 16 | Ht 64.75 in | Wt 145.0 lb

## 2014-12-05 DIAGNOSIS — K5900 Constipation, unspecified: Secondary | ICD-10-CM

## 2014-12-05 DIAGNOSIS — R112 Nausea with vomiting, unspecified: Secondary | ICD-10-CM | POA: Diagnosis not present

## 2014-12-05 DIAGNOSIS — R1013 Epigastric pain: Secondary | ICD-10-CM | POA: Diagnosis not present

## 2014-12-05 LAB — POCT CBC

## 2014-12-05 LAB — POCT URINALYSIS DIPSTICK
Bilirubin, UA: NEGATIVE
Blood, UA: NEGATIVE
Glucose, UA: NEGATIVE
KETONES UA: NEGATIVE
Leukocytes, UA: NEGATIVE
Nitrite, UA: NEGATIVE
PH UA: 7
Spec Grav, UA: 1.02
Urobilinogen, UA: 1

## 2014-12-05 LAB — POCT URINE PREGNANCY: Preg Test, Ur: NEGATIVE

## 2014-12-05 MED ORDER — ONDANSETRON 4 MG PO TBDP: 4.0000 mg | ORAL_TABLET | Freq: Three times a day (TID) | ORAL | Status: DC | PRN

## 2014-12-05 MED ORDER — OMEPRAZOLE 40 MG PO CPDR: 40.0000 mg | DELAYED_RELEASE_CAPSULE | Freq: Every day | ORAL | Status: DC

## 2014-12-05 NOTE — Patient Instructions (Signed)
For the next couple of days stick with liquids such as water, gatorade, juice and broth. Use the zofran as needed for nausea Also try taking a dose of miralax once a day until you are no longer constipated.  Once you are feeling better you can try eating again- stick with bland foods such as saltine crackers, bananas If you are not improved over the next 1-2 days please call or come back in to linic

## 2014-12-05 NOTE — Progress Notes (Signed)
[facility] [address], [place] [address] [phone]  Date:  12/05/2014   Name:  Tracy Murphy   DOB:  10/01/1990   MRN:  [id number]  PCP:  Altamese CarolinaMARTIN,TANYA D, MD    Chief Complaint: Abdominal Pain; Emesis; and Hiccups   History of Present Illness:  Tracy Jasperkhlas O Hanford is a 24 y.o. very pleasant female patient who presents with the following:  Established pt here today with illness.  She has a "burning feeling in my stomach" and feels "bloated and full."  She is nauseated, and has been vomiting after trying to eat.  This has been going on since Monday- today is Friday.  Her sx are getting worse.   She is burping and has hiccups.   She has been constipated, but yesterday she had "diarrhea from nowhere."  She has not had a stool today. She has "been trying not to eat."  However she has been able to drink liquids pretty well.   She has never had this in the past   She does take omeprazole which she thinks helps a little, and amoxicillin currently for a tooth problem No sick contacts, no recent travel or suspicous foods.   She has been using tylenol as well.   Her LMP was about 6 weeks ago but her menses tend to be irregular for which she is seeing OBG  Patient Active Problem List   Diagnosis Date Noted  . Migraine 10/11/2013  . Abdominal pain, epigastric 05/02/2013    Past Medical History  Diagnosis Date  . Headache     Past Surgical History  Procedure Laterality Date  . Wisdom tooth extraction      History  Substance Use Topics  . Smoking status: Never Smoker   . Smokeless tobacco: Never Used  . Alcohol Use: No    Family History  Problem Relation Age of Onset  . Diabetes Father   . Heart disease Father   . Hypertension Father   . Migraines Father   . Hyperlipidemia Father     Allergies  Allergen Reactions  . Reglan [Metoclopramide] Anxiety and Other (See Comments)    DYSTONIA    Medication list has been reviewed and updated.  Current  Outpatient Prescriptions on File Prior to Visit  Medication Sig Dispense Refill  . acetaminophen (TYLENOL) 325 MG tablet Take 650 mg by mouth every 6 (six) hours as needed (pain).    Marland Kitchen. omeprazole (PRILOSEC) 40 MG capsule Take 1 capsule (40 mg total) by mouth daily. 30 capsule 3  . HYDROcodone-acetaminophen (NORCO/VICODIN) 5-325 MG per tablet Take 1 tablet by mouth every 6 (six) hours as needed for moderate pain. (Patient not taking: Reported on 12/05/2014) 15 tablet 0  . metoCLOPramide (REGLAN) 5 MG tablet Take one twice daily before breakfast and before supper for nausea (Patient not taking: Reported on 12/05/2014) 60 tablet 0  . oxyCODONE-acetaminophen (PERCOCET/ROXICET) 5-325 MG per tablet 1 to 2 tabs PO q6hrs  PRN for pain (Patient not taking: Reported on 12/05/2014) 15 tablet 0  . potassium chloride (K-DUR) 10 MEQ tablet Take 3 tablets (30 mEq total) by mouth daily. (Patient not taking: Reported on 12/05/2014) 9 tablet 0  . promethazine (PHENERGAN) 25 MG tablet Take 1 tablet (25 mg total) by mouth every 6 (six) hours as needed for nausea or vomiting. (Patient not taking: Reported on 12/05/2014) 12 tablet 0   No current facility-administered medications on file prior to visit.    Review of Systems:  As per HPI- otherwise negative.   Physical Examination: Filed Vitals:   12/05/14 1005  BP: 120/80  Pulse: 87  Temp: 98 F (36.7 C)  Resp: 16   Filed Vitals:   12/05/14 1005  Height: 5' 4.75" (1.645 m)  Weight: 145 lb (65.772 kg)   Body mass index is 24.31 kg/(m^2). Ideal Body Weight: Weight in (lb) to have BMI = 25: 148.8  GEN: WDWN, NAD, Non-toxic, A & O x 3, looks well HEENT: Atraumatic, Normocephalic. Neck supple. No masses, No LAD. Ears and Nose: No external deformity. CV: RRR, No M/G/R. No JVD. No thrill. No extra heart sounds. PULM: CTA B, no wheezes, crackles, rhonchi. No retractions. No resp. distress. No accessory muscle use. ABD: Soft abdomen, no guarding, No rebound. No HSM.   Mildly distended abd, mild generalizad tenderness. Normal bowel sounds  EXTR: No c/c/e NEURO Normal gait.  PSYCH: Normally interactive. Conversant. Not depressed or anxious appearing.  Calm demeanor.   We tried several times but were unable to obtain a venipuncture.  Attempted a CBC through fingerstick but blood clotted.  Pt did not wish to try again.   Results for orders placed or performed in visit on 12/05/14  POCT urinalysis dipstick  Result Value Ref Range   Color, UA yellow    Clarity, UA clear    Glucose, UA neg    Bilirubin, UA neg    Ketones, UA neg    Spec Grav, UA 1.020    Blood, UA neg    pH, UA 7.0    Protein, UA trace    Urobilinogen, UA 1.0    Nitrite, UA neg    Leukocytes, UA Negative   POCT urine pregnancy  Result Value Ref Range   Preg Test, Ur Negative     [facility] reading (PRIMARY) by  Dr. Patsy Lageropland. Abs series: air through colon, OW negative. No evidence of SBO.  DG ABDOMEN ACUTE W/ 1V CHEST  COMPARISON: Chest radiograph October 08, 2010; abdominal radiographs March 28, 2014  FINDINGS: PA chest: Lungs are clear. Heart size and pulmonary vascularity are normal. No adenopathy.  Supine and upright abdomen: There is moderate stool in the right colon. There is no bowel dilatation or air-fluid level suggesting obstruction. No free air. No abnormal calcifications.  IMPRESSION: Bowel gas pattern normal. No obstruction or free air. Moderate stool in right colon. Lungs clear.  Assessment and Plan: Non-intractable vomiting with nausea, vomiting of unspecified type - Plan: POCT CBC, POCT urinalysis dipstick, POCT urine pregnancy, Comprehensive metabolic panel, ondansetron (ZOFRAN ODT) 4 MG disintegrating tablet  Constipation, unspecified constipation type - Plan: DG Abd Acute W/Chest  Dyspepsia - Plan: omeprazole (PRILOSEC) 40 MG capsule  Suspect that she has constipation leading to her sensation of bloating.  We were unable to get labs, but as she does not have  an acute abdomen will manage conservatively with close follow-up  See patient instructions for more details.     Signed Abbe AmsterdamJessica Tameisha Covell, MD

## 2014-12-09 ENCOUNTER — Emergency Department (HOSPITAL_COMMUNITY): Payer: BLUE CROSS/BLUE SHIELD

## 2014-12-09 ENCOUNTER — Encounter (HOSPITAL_COMMUNITY): Payer: Self-pay | Admitting: *Deleted

## 2014-12-09 ENCOUNTER — Emergency Department (HOSPITAL_COMMUNITY)
Admission: EM | Admit: 2014-12-09 | Discharge: 2014-12-09 | Disposition: A | Discharge status: Home / Self Care | Payer: BLUE CROSS/BLUE SHIELD | Attending: Emergency Medicine | Admitting: Emergency Medicine

## 2014-12-09 DIAGNOSIS — Z3202 Encounter for pregnancy test, result negative: Secondary | ICD-10-CM | POA: Diagnosis not present

## 2014-12-09 DIAGNOSIS — R110 Nausea: Secondary | ICD-10-CM | POA: Insufficient documentation

## 2014-12-09 DIAGNOSIS — R1011 Right upper quadrant pain: Secondary | ICD-10-CM | POA: Insufficient documentation

## 2014-12-09 DIAGNOSIS — R000 Tachycardia, unspecified: Secondary | ICD-10-CM | POA: Insufficient documentation

## 2014-12-09 DIAGNOSIS — Z792 Long term (current) use of antibiotics: Secondary | ICD-10-CM | POA: Diagnosis not present

## 2014-12-09 DIAGNOSIS — R1013 Epigastric pain: Secondary | ICD-10-CM | POA: Insufficient documentation

## 2014-12-09 DIAGNOSIS — Z79899 Other long term (current) drug therapy: Secondary | ICD-10-CM | POA: Diagnosis not present

## 2014-12-09 LAB — CBC WITH DIFFERENTIAL/PLATELET
BASOS PCT: 0 % (ref 0–1)
Basophils Absolute: 0 10*3/uL (ref 0.0–0.1)
EOS PCT: 3 % (ref 0–5)
Eosinophils Absolute: 0.2 10*3/uL (ref 0.0–0.7)
HCT: 33.6 % — ABNORMAL LOW (ref 36.0–46.0)
Hemoglobin: 10.6 g/dL — ABNORMAL LOW (ref 12.0–15.0)
LYMPHS ABS: 2.1 10*3/uL (ref 0.7–4.0)
Lymphocytes Relative: 38 % (ref 12–46)
MCH: 26.2 pg (ref 26.0–34.0)
MCHC: 31.5 g/dL (ref 30.0–36.0)
MCV: 83.2 fL (ref 78.0–100.0)
Monocytes Absolute: 0.7 10*3/uL (ref 0.1–1.0)
Monocytes Relative: 12 % (ref 3–12)
Neutro Abs: 2.6 10*3/uL (ref 1.7–7.7)
Neutrophils Relative %: 47 % (ref 43–77)
Platelets: 389 10*3/uL (ref 150–400)
RBC: 4.04 MIL/uL (ref 3.87–5.11)
RDW: 14.6 % (ref 11.5–15.5)
WBC: 5.6 10*3/uL (ref 4.0–10.5)

## 2014-12-09 LAB — PREGNANCY, URINE: Preg Test, Ur: NEGATIVE

## 2014-12-09 LAB — COMPREHENSIVE METABOLIC PANEL
ALT: 5 U/L — AB (ref 14–54)
AST: 27 U/L (ref 15–41)
Albumin: 4.4 g/dL (ref 3.5–5.0)
Alkaline Phosphatase: 79 U/L (ref 38–126)
Anion gap: 11 (ref 5–15)
BUN: <5 mg/dL — ABNORMAL LOW (ref 6–20)
CO2: 24 mmol/L (ref 22–32)
Calcium: 9.5 mg/dL (ref 8.9–10.3)
Chloride: 102 mmol/L (ref 101–111)
Creatinine, Ser: 0.62 mg/dL (ref 0.44–1.00)
GFR calc Af Amer: >60 mL/min (ref >60)
Glucose, Bld: 94 mg/dL (ref 70–99)
Potassium: 3.8 mmol/L (ref 3.5–5.1)
SODIUM: 137 mmol/L (ref 135–145)
Total Bilirubin: 1 mg/dL (ref 0.3–1.2)
Total Protein: 8.2 g/dL — ABNORMAL HIGH (ref 6.5–8.1)

## 2014-12-09 LAB — URINALYSIS, ROUTINE W REFLEX MICROSCOPIC
Bilirubin Urine: NEGATIVE
Glucose, UA: NEGATIVE mg/dL
Hgb urine dipstick: NEGATIVE
KETONES UR: NEGATIVE mg/dL
LEUKOCYTES UA: NEGATIVE
Nitrite: NEGATIVE
PH: 6.5 (ref 5.0–8.0)
Protein, ur: NEGATIVE mg/dL
Specific Gravity, Urine: 1.002 — ABNORMAL LOW (ref 1.005–1.030)
Urobilinogen, UA: 0.2 mg/dL (ref 0.0–1.0)

## 2014-12-09 LAB — LIPASE, BLOOD: Lipase: 19 U/L — ABNORMAL LOW (ref 22–51)

## 2014-12-09 LAB — I-STAT CG4 LACTIC ACID, ED: Lactic Acid, Venous: 1.69 mmol/L (ref 0.5–2.0)

## 2014-12-09 MED ORDER — PROMETHAZINE HCL 25 MG PO TABS: 25.0000 mg | ORAL_TABLET | Freq: Four times a day (QID) | ORAL | Status: DC | PRN

## 2014-12-09 MED ORDER — SUCRALFATE 1 G PO TABS: 1.0000 g | ORAL_TABLET | Freq: Four times a day (QID) | ORAL | Status: DC

## 2014-12-09 MED ORDER — IOHEXOL 300 MG/ML  SOLN
50.0000 mL | Freq: Once | INTRAMUSCULAR | Status: AC | PRN
  Administered 2014-12-09: 50 mL via ORAL

## 2014-12-09 MED ORDER — SODIUM CHLORIDE 0.9 % IV SOLN
1000.0000 mL | Freq: Once | INTRAVENOUS | Status: AC
  Administered 2014-12-09: 1000 mL via INTRAVENOUS

## 2014-12-09 MED ORDER — FENTANYL CITRATE (PF) 100 MCG/2ML IJ SOLN
50.0000 ug | Freq: Once | INTRAMUSCULAR | Status: AC
  Administered 2014-12-09: 50 ug via INTRAVENOUS
  Filled 2014-12-09: qty 2

## 2014-12-09 MED ORDER — IOHEXOL 300 MG/ML  SOLN
100.0000 mL | Freq: Once | INTRAMUSCULAR | Status: AC | PRN
  Administered 2014-12-09: 100 mL via INTRAVENOUS

## 2014-12-09 MED ORDER — MORPHINE SULFATE 4 MG/ML IJ SOLN: 4.0000 mg | Freq: Once | INTRAMUSCULAR | Status: DC

## 2014-12-09 MED ORDER — PROMETHAZINE HCL 25 MG RE SUPP: 25.0000 mg | Freq: Four times a day (QID) | RECTAL | Status: DC | PRN

## 2014-12-09 MED ORDER — TRAMADOL HCL 50 MG PO TABS
50.0000 mg | ORAL_TABLET | Freq: Once | ORAL | Status: AC
  Administered 2014-12-09: 50 mg via ORAL
  Filled 2014-12-09: qty 1

## 2014-12-09 MED ORDER — SODIUM CHLORIDE 0.9 % IV SOLN
1000.0000 mL | INTRAVENOUS | Status: DC
  Administered 2014-12-09: 1000 mL via INTRAVENOUS

## 2014-12-09 NOTE — Discharge Instructions (Signed)
Take medications as prescribed.  Stick to a bland diet until you're feeling better.  It is important for you to eat and drink even if you're feeling nauseated.  Follow up in your doctor this week.  Return to the emergency department for worsening condition or new concerning symptoms.

## 2014-12-09 NOTE — ED Provider Notes (Signed)
CSN: [id number]     Arrival date & time 12/09/14  0219 History   First MD Initiated Contact with Patient 12/09/14 [phone]     Chief Complaint  Patient presents with  . Abdominal Pain     (Consider location/radiation/quality/duration/timing/severity/associated sxs/prior Treatment) HPI 24 year old female presents to emergency department from home with complaint of abdominal pain for the last 4 days.  Patient reports he is had a problem with constipation for some time.  She was seen at urgent care and given Mira lax and Prilosec.  Patient has had nausea without vomiting.  Patient reports 10 pound weight loss over the last month without trying.  She started a new job and reports that she attributed to working and walking more.  Pain is worse in epigastrium and right upper quadrant. Past Medical History  Diagnosis Date  . Headache    Past Surgical History  Procedure Laterality Date  . Wisdom tooth extraction     Family History  Problem Relation Age of Onset  . Diabetes Father   . Heart disease Father   . Hypertension Father   . Migraines Father   . Hyperlipidemia Father    History  Substance Use Topics  . Smoking status: Never Smoker   . Smokeless tobacco: Never Used  . Alcohol Use: No   OB History    No data available     Review of Systems   See History of Present Illness; otherwise all other systems are reviewed and negative  Allergies  Reglan  Home Medications   Prior to Admission medications   Medication Sig Start Date End Date Taking? Authorizing Provider  acetaminophen (TYLENOL) 325 MG tablet Take 650 mg by mouth every 6 (six) hours as needed (pain).    Historical Provider, MD  amoxicillin (AMOXIL) 500 MG capsule Take 500 mg by mouth 3 (three) times daily.    Historical Provider, MD  omeprazole (PRILOSEC) 40 MG capsule Take 1 capsule (40 mg total) by mouth daily. 12/05/14   Gwenlyn FoundJessica C Copland, MD  ondansetron (ZOFRAN ODT) 4 MG disintegrating tablet Take 1 tablet (4 mg  total) by mouth every 8 (eight) hours as needed for nausea or vomiting. 12/05/14   Gwenlyn FoundJessica C Copland, MD   BP 127/80 mmHg  Pulse 113  Temp(Src) 98.3 F (36.8 C) (Oral)  Resp 16  SpO2 95%  LMP  (LMP Unknown) Physical Exam  Constitutional: She is oriented to person, place, and time. She appears well-developed and well-nourished. She appears distressed.  HENT:  Head: Normocephalic and atraumatic.  Nose: Nose normal.  Dry mucous membranes, dry tongue and mouth  Eyes: Conjunctivae and EOM are normal. Pupils are equal, round, and reactive to light.  Neck: Normal range of motion. Neck supple. No JVD present. No tracheal deviation present. No thyromegaly present.  Cardiovascular: Regular rhythm, normal heart sounds and intact distal pulses.  Exam reveals no gallop and no friction rub.   No murmur heard. Tachycardia  Pulmonary/Chest: Effort normal and breath sounds normal. No stridor. No respiratory distress. She has no wheezes. She has no rales. She exhibits no tenderness.  Abdominal: Soft. She exhibits no distension and no mass. There is tenderness (diffuse tenderness worse in right upper and epigastrium). There is no rebound and no guarding.  Hyperactive bowel sounds  Musculoskeletal: Normal range of motion. She exhibits no edema or tenderness.  Lymphadenopathy:    She has no cervical adenopathy.  Neurological: She is alert and oriented to person, place, and time. She displays normal reflexes.  She exhibits normal muscle tone. Coordination normal.  Skin: Skin is warm and dry. No rash noted. No erythema. No pallor.  Psychiatric: She has a normal mood and affect. Her behavior is normal. Judgment and thought content normal.  Nursing note and vitals reviewed.   ED Course  Procedures (including critical care time) Labs Review Labs Reviewed  COMPREHENSIVE METABOLIC PANEL - Abnormal; Notable for the following:    BUN <5 (*)    Total Protein 8.2 (*)    ALT 5 (*)    All other components within  normal limits  LIPASE, BLOOD - Abnormal; Notable for the following:    Lipase 19 (*)    All other components within normal limits  URINALYSIS, ROUTINE W REFLEX MICROSCOPIC - Abnormal; Notable for the following:    Specific Gravity, Urine 1.002 (*)    All other components within normal limits  CBC WITH DIFFERENTIAL/PLATELET - Abnormal; Notable for the following:    Hemoglobin 10.6 (*)    HCT 33.6 (*)    All other components within normal limits  PREGNANCY, URINE  CBC WITH DIFFERENTIAL/PLATELET  I-STAT CG4 LACTIC ACID, ED    Imaging Review Ct Abdomen Pelvis W Contrast  12/09/2014   CLINICAL DATA:  Abdominal pain and nausea for 4 days. Negative urine pregnancy test. Pain worse on the right side.  EXAM: CT ABDOMEN AND PELVIS WITH CONTRAST  TECHNIQUE: Multidetector CT imaging of the abdomen and pelvis was performed using the standard protocol following bolus administration of intravenous contrast.  CONTRAST:  100mL OMNIPAQUE IOHEXOL 300 MG/ML  SOLN  COMPARISON:  02/08/2014  FINDINGS: Lung bases are clear.  The liver, spleen, gallbladder, pancreas, adrenal glands, kidneys, abdominal aorta, inferior vena cava, and retroperitoneal lymph nodes are unremarkable. Stomach, small bowel, and colon are not abnormally distended. No free air or free fluid in the abdomen. Abdominal wall musculature appears intact.  Pelvis: Small amount of free fluid in the pelvis is likely physiologic. Uterus and ovaries are not enlarged. Probable functional cyst in the left ovary. Bladder wall is not thickened. No pelvic mass or lymphadenopathy. Appendix is not identified. No destructive bone lesions.  IMPRESSION: Present physiologic cyst and free fluid in the left ovary and pelvis. No acute process demonstrated in the abdomen or pelvis to account for patient's symptoms. No evidence of bowel obstruction.   Electronically Signed   By: Burman NievesWilliam  Stevens M.D.   On: 12/09/2014 06:04     EKG Interpretation None      MDM   Final  diagnoses:  Abdominal pain, epigastric    24 year old female with 4 days of abdominal pain, constipation.  No improvement with Mira lax and Prilosec.  Patient arrives tachycardic and ill-appearing.  Patient to receive IV fluids, pain control.  Labs.  5:49 AM Persistent pain.  Patient received CT scan.  Labs overall reassuring.    Marisa Severinlga Deniesha Stenglein, MD 12/09/14 [phone]

## 2014-12-09 NOTE — ED Notes (Signed)
Pt in CT.

## 2014-12-09 NOTE — ED Notes (Signed)
Patient just wanted to drink fluids as of right now but tolerated it well

## 2014-12-09 NOTE — ED Notes (Signed)
Pt reports abd pain x 4 days.  [facility] and [facility]acid.  Pt reports nausea at this time with abd pain.

## 2014-12-25 ENCOUNTER — Other Ambulatory Visit: Payer: Self-pay | Admitting: Gastroenterology

## 2014-12-25 DIAGNOSIS — R1011 Right upper quadrant pain: Secondary | ICD-10-CM

## 2014-12-25 DIAGNOSIS — R110 Nausea: Secondary | ICD-10-CM

## 2014-12-26 ENCOUNTER — Other Ambulatory Visit: Payer: Self-pay | Admitting: Gastroenterology

## 2014-12-26 DIAGNOSIS — R110 Nausea: Secondary | ICD-10-CM

## 2014-12-26 DIAGNOSIS — R1011 Right upper quadrant pain: Secondary | ICD-10-CM

## 2014-12-29 ENCOUNTER — Emergency Department (HOSPITAL_COMMUNITY): Payer: BLUE CROSS/BLUE SHIELD

## 2014-12-29 ENCOUNTER — Encounter (HOSPITAL_COMMUNITY): Payer: Self-pay | Admitting: Emergency Medicine

## 2014-12-29 ENCOUNTER — Emergency Department (HOSPITAL_COMMUNITY)
Admission: EM | Admit: 2014-12-29 | Discharge: 2014-12-29 | Disposition: A | Discharge status: Home / Self Care | Payer: BLUE CROSS/BLUE SHIELD | Attending: Emergency Medicine | Admitting: Emergency Medicine

## 2014-12-29 DIAGNOSIS — R300 Dysuria: Secondary | ICD-10-CM | POA: Insufficient documentation

## 2014-12-29 DIAGNOSIS — K5900 Constipation, unspecified: Secondary | ICD-10-CM | POA: Insufficient documentation

## 2014-12-29 DIAGNOSIS — Z79899 Other long term (current) drug therapy: Secondary | ICD-10-CM | POA: Insufficient documentation

## 2014-12-29 DIAGNOSIS — R1084 Generalized abdominal pain: Secondary | ICD-10-CM | POA: Diagnosis present

## 2014-12-29 DIAGNOSIS — R109 Unspecified abdominal pain: Secondary | ICD-10-CM

## 2014-12-29 DIAGNOSIS — R110 Nausea: Secondary | ICD-10-CM | POA: Insufficient documentation

## 2014-12-29 LAB — URINALYSIS, ROUTINE W REFLEX MICROSCOPIC
Bilirubin Urine: NEGATIVE
GLUCOSE, UA: NEGATIVE mg/dL
Hgb urine dipstick: NEGATIVE
Ketones, ur: NEGATIVE mg/dL
LEUKOCYTES UA: NEGATIVE
NITRITE: NEGATIVE
Protein, ur: NEGATIVE mg/dL
Specific Gravity, Urine: 1.005 (ref 1.005–1.030)
Urobilinogen, UA: 0.2 mg/dL (ref 0.0–1.0)
pH: 6 (ref 5.0–8.0)

## 2014-12-29 LAB — CBC WITH DIFFERENTIAL/PLATELET
BASOS PCT: 0 % (ref 0–1)
Basophils Absolute: 0 10*3/uL (ref 0.0–0.1)
Eosinophils Absolute: 0.2 10*3/uL (ref 0.0–0.7)
Eosinophils Relative: 3 % (ref 0–5)
HCT: 39.5 % (ref 36.0–46.0)
Hemoglobin: 12.6 g/dL (ref 12.0–15.0)
LYMPHS ABS: 2 10*3/uL (ref 0.7–4.0)
Lymphocytes Relative: 40 % (ref 12–46)
MCH: 27 pg (ref 26.0–34.0)
MCHC: 31.9 g/dL (ref 30.0–36.0)
MCV: 84.6 fL (ref 78.0–100.0)
Monocytes Absolute: 0.4 10*3/uL (ref 0.1–1.0)
Monocytes Relative: 9 % (ref 3–12)
Neutro Abs: 2.4 10*3/uL (ref 1.7–7.7)
Neutrophils Relative %: 48 % (ref 43–77)
PLATELETS: 459 10*3/uL — AB (ref 150–400)
RBC: 4.67 MIL/uL (ref 3.87–5.11)
RDW: 14.8 % (ref 11.5–15.5)
WBC: 5.1 10*3/uL (ref 4.0–10.5)

## 2014-12-29 LAB — COMPREHENSIVE METABOLIC PANEL
ALBUMIN: 4 g/dL (ref 3.5–5.0)
ALK PHOS: 80 U/L (ref 38–126)
ALT: 10 U/L — ABNORMAL LOW (ref 14–54)
ANION GAP: 8 (ref 5–15)
AST: 21 U/L (ref 15–41)
BUN: <5 mg/dL — ABNORMAL LOW (ref 6–20)
CO2: 27 mmol/L (ref 22–32)
Calcium: 9.2 mg/dL (ref 8.9–10.3)
Chloride: 103 mmol/L (ref 101–111)
Creatinine, Ser: 0.52 mg/dL (ref 0.44–1.00)
GFR calc Af Amer: >60 mL/min (ref >60)
GFR calc non Af Amer: >60 mL/min (ref >60)
Glucose, Bld: 92 mg/dL (ref 65–99)
POTASSIUM: 3.4 mmol/L — AB (ref 3.5–5.1)
SODIUM: 138 mmol/L (ref 135–145)
TOTAL PROTEIN: 7.9 g/dL (ref 6.5–8.1)
Total Bilirubin: 0.3 mg/dL (ref 0.3–1.2)

## 2014-12-29 LAB — I-STAT BETA HCG BLOOD, ED (MC, WL, AP ONLY): I-stat hCG, quantitative: <5 m[IU]/mL (ref <5)

## 2014-12-29 LAB — LIPASE, BLOOD: Lipase: 18 U/L — ABNORMAL LOW (ref 22–51)

## 2014-12-29 MED ORDER — SODIUM CHLORIDE 0.9 % IV BOLUS (SEPSIS)
1000.0000 mL | Freq: Once | INTRAVENOUS | Status: AC
  Administered 2014-12-29: 1000 mL via INTRAVENOUS

## 2014-12-29 MED ORDER — OXYCODONE-ACETAMINOPHEN 5-325 MG PO TABS
1.0000 | ORAL_TABLET | Freq: Once | ORAL | Status: AC
  Administered 2014-12-29: 1 via ORAL
  Filled 2014-12-29: qty 1

## 2014-12-29 MED ORDER — ONDANSETRON HCL 4 MG/2ML IJ SOLN
4.0000 mg | Freq: Once | INTRAMUSCULAR | Status: AC
  Administered 2014-12-29: 4 mg via INTRAVENOUS
  Filled 2014-12-29: qty 2

## 2014-12-29 MED ORDER — GI COCKTAIL ~~LOC~~
30.0000 mL | Freq: Once | ORAL | Status: AC
  Administered 2014-12-29: 30 mL via ORAL
  Filled 2014-12-29: qty 30

## 2014-12-29 NOTE — Discharge Instructions (Signed)

## 2014-12-29 NOTE — ED Provider Notes (Signed)
CSN: [id number]     Arrival date & time 12/29/14  0640 History   First MD Initiated Contact with Patient 12/29/14 [phone]     Chief Complaint  Patient presents with  . Abdominal Pain     (Consider location/radiation/quality/duration/timing/severity/associated sxs/prior Treatment) Patient is a 24 y.o. female presenting with abdominal pain.  Abdominal Pain Pain location:  Generalized Pain quality: burning   Pain radiates to:  Does not radiate Pain severity:  Severe Onset quality:  Gradual Duration:  4 weeks Timing:  Constant Progression:  Worsening Chronicity:  New Context: not previous surgeries   Context comment:  Seen recently for same Relieved by:  Nothing Worsened by:  Movement, not moving and palpation Associated symptoms: anorexia, constipation, dysuria and nausea   Associated symptoms: no fever and no vomiting     Past Medical History  Diagnosis Date  . Headache    Past Surgical History  Procedure Laterality Date  . Wisdom tooth extraction     Family History  Problem Relation Age of Onset  . Diabetes Father   . Heart disease Father   . Hypertension Father   . Migraines Father   . Hyperlipidemia Father    History  Substance Use Topics  . Smoking status: Never Smoker   . Smokeless tobacco: Never Used  . Alcohol Use: No   OB History    No data available     Review of Systems  Constitutional: Negative for fever.  Gastrointestinal: Positive for nausea, abdominal pain, constipation and anorexia. Negative for vomiting.  Genitourinary: Positive for dysuria.  All other systems reviewed and are negative.     Allergies  Reglan  Home Medications   Prior to Admission medications   Medication Sig Start Date End Date Taking? Authorizing Provider  acetaminophen (TYLENOL) 500 MG tablet Take 1,000 mg by mouth every 6 (six) hours as needed (For pain.).   Yes Historical Provider, MD  dexlansoprazole (DEXILANT) 60 MG capsule Take 60 mg by mouth daily.   Yes  Historical Provider, MD  docusate sodium (COLACE) 100 MG capsule Take 200 mg by mouth at bedtime.   Yes Historical Provider, MD  omeprazole (PRILOSEC) 40 MG capsule Take 1 capsule (40 mg total) by mouth daily. Patient taking differently: Take 40 mg by mouth at bedtime.  12/05/14  Yes Gwenlyn Found Copland, MD  polyethylene glycol (MIRALAX / GLYCOLAX) packet Take 17 g by mouth daily as needed (For constipation.).    Yes Historical Provider, MD  promethazine (PHENERGAN) 25 MG tablet Take 1 tablet (25 mg total) by mouth every 6 (six) hours as needed for nausea. 12/09/14  Yes Marisa Severin, MD  sucralfate (CARAFATE) 1 G tablet Take 1 tablet (1 g total) by mouth 4 (four) times daily. 12/09/14  Yes Marisa Severin, MD  ondansetron (ZOFRAN ODT) 4 MG disintegrating tablet Take 1 tablet (4 mg total) by mouth every 8 (eight) hours as needed for nausea or vomiting. Patient not taking: Reported on 12/29/2014 12/05/14   Pearline Cables, MD  promethazine (PHENERGAN) 25 MG suppository Place 1 suppository (25 mg total) rectally every 6 (six) hours as needed for nausea or vomiting. Patient not taking: Reported on 12/29/2014 12/09/14   Marisa Severin, MD   BP 98/61 mmHg  Pulse 88  Temp(Src) 98.1 F (36.7 C) (Oral)  Resp 16  Ht  (1.6 m)  Wt 145 lb (65.772 kg)  BMI 25.69 kg/m2  SpO2 100%  LMP  (LMP Unknown) Physical Exam  Constitutional: She is oriented to  person, place, and time. She appears well-developed and well-nourished.  HENT:  Head: Normocephalic and atraumatic.  Right Ear: External ear normal.  Left Ear: External ear normal.  Eyes: Conjunctivae and EOM are normal. Pupils are equal, round, and reactive to light.  Neck: Normal range of motion. Neck supple.  Cardiovascular: Normal rate, regular rhythm, normal heart sounds and intact distal pulses.   Pulmonary/Chest: Effort normal and breath sounds normal.  Abdominal: Soft. Bowel sounds are normal. There is tenderness in the right upper quadrant, right lower  quadrant, epigastric area and left upper quadrant. There is CVA tenderness (R).  Musculoskeletal: Normal range of motion.  Neurological: She is alert and oriented to person, place, and time.  Skin: Skin is warm and dry.  Vitals reviewed.   ED Course  Procedures (including critical care time) Labs Review Labs Reviewed  LIPASE, BLOOD - Abnormal; Notable for the following:    Lipase 18 (*)    All other components within normal limits  COMPREHENSIVE METABOLIC PANEL - Abnormal; Notable for the following:    Potassium 3.4 (*)    BUN <5 (*)    ALT 10 (*)    All other components within normal limits  CBC WITH DIFFERENTIAL/PLATELET - Abnormal; Notable for the following:    Platelets 459 (*)    All other components within normal limits  URINALYSIS, ROUTINE W REFLEX MICROSCOPIC (NOT AT [facility])  I-STAT BETA HCG BLOOD, ED ([facility], [facility], [facility] ONLY)    Imaging Review [place] Abdomen Complete  12/29/2014   CLINICAL DATA:  Right upper quadrant pain for 1 month  EXAM: ULTRASOUND ABDOMEN COMPLETE  COMPARISON:  12/09/2014  FINDINGS: Gallbladder: No gallstones or wall thickening visualized. No sonographic Murphy sign noted.  Common bile duct: Diameter: 4.1 mm.  Liver: No focal lesion identified. Within normal limits in parenchymal echogenicity.  IVC: No abnormality visualized.  Pancreas: Visualized portion unremarkable.  Spleen: Size and appearance within normal limits.  Right Kidney: Length: 10.9 cm. Echogenicity within normal limits. No mass or hydronephrosis visualized.  Left Kidney: Length: 12.2 cm. Echogenicity within normal limits. No mass or hydronephrosis visualized.  Abdominal aorta: No aneurysm visualized.  Other findings: None.  IMPRESSION: No acute abnormality noted.   Electronically Signed   By: Alcide CleverMark  Lukens M.D.   On: 12/29/2014 10:13     EKG Interpretation None      MDM   Final diagnoses:  Abdominal pain    24 y.o. female with pertinent PMH of prior HA presents with continued abd pain x 1 month,  states that symptoms are worsening, cannot wait until Friday for her GI appointment.  She was seen 2 weeks ago for same symptoms and received a CT scan, which was unremarkable.  On arrival today, vitals and physical exam as above, identical to prior visit.  Pt refuses pelvic exam as she states she is a virgin.    Wu as above, unremarkable.  Symptoms relieved by GI cocktail.  DC home to fu with GI.    I have reviewed all laboratory and imaging studies if ordered as above  1. Abdominal pain         Mirian MoMatthew Athira Janowicz, MD 12/30/14 [phone]

## 2014-12-29 NOTE — ED Notes (Signed)
RN will start an IV line

## 2014-12-29 NOTE — ED Notes (Signed)
Pt returned from US

## 2014-12-29 NOTE — ED Notes (Signed)
Patient transported to Ultrasound 

## 2014-12-29 NOTE — ED Notes (Signed)
Patient complaining of abdominal pain. Patient states that eating is making her stomach hurts. Patient is making herself vomit when her stomach hurts.

## 2014-12-29 NOTE — ED Notes (Signed)
Pt from home c.o RUQ abdominal pain x 1 month that progressively gotten worse since last night. Seen GI and has US scheduled for Friday but reports pain is too bad to wait until then

## 2015-01-02 ENCOUNTER — Ambulatory Visit (HOSPITAL_COMMUNITY): Payer: BLUE CROSS/BLUE SHIELD

## 2015-01-09 ENCOUNTER — Ambulatory Visit (HOSPITAL_COMMUNITY)
Admission: RE | Admit: 2015-01-09 | Discharge: 2015-01-09 | Disposition: A | Discharge status: Home / Self Care | Payer: BLUE CROSS/BLUE SHIELD | Source: Ambulatory Visit | Attending: Gastroenterology | Admitting: Gastroenterology

## 2015-01-09 DIAGNOSIS — R1011 Right upper quadrant pain: Secondary | ICD-10-CM | POA: Diagnosis not present

## 2015-01-09 DIAGNOSIS — R110 Nausea: Secondary | ICD-10-CM

## 2015-01-09 DIAGNOSIS — R112 Nausea with vomiting, unspecified: Secondary | ICD-10-CM | POA: Insufficient documentation

## 2015-01-09 MED ORDER — SINCALIDE 5 MCG IJ SOLR
0.0200 ug/kg | Freq: Once | INTRAMUSCULAR | Status: AC
  Administered 2015-01-09: 1.3 ug via INTRAVENOUS

## 2015-01-09 MED ORDER — TECHNETIUM TC 99M MEBROFENIN IV KIT
5.4000 | PACK | Freq: Once | INTRAVENOUS | Status: AC | PRN
  Administered 2015-01-09: 5.4 via INTRAVENOUS

## 2015-01-28 ENCOUNTER — Encounter: Payer: Self-pay | Admitting: Emergency Medicine

## 2015-02-28 ENCOUNTER — Ambulatory Visit (INDEPENDENT_AMBULATORY_CARE_PROVIDER_SITE_OTHER): Payer: BLUE CROSS/BLUE SHIELD | Admitting: Family Medicine

## 2015-02-28 ENCOUNTER — Ambulatory Visit (INDEPENDENT_AMBULATORY_CARE_PROVIDER_SITE_OTHER): Payer: BLUE CROSS/BLUE SHIELD

## 2015-02-28 VITALS — BP 102/68 | HR 104 | Temp 98.5°F | Resp 18 | Ht 65.0 in | Wt 140.0 lb

## 2015-02-28 DIAGNOSIS — R12 Heartburn: Secondary | ICD-10-CM | POA: Diagnosis not present

## 2015-02-28 DIAGNOSIS — R1011 Right upper quadrant pain: Secondary | ICD-10-CM | POA: Diagnosis not present

## 2015-02-28 DIAGNOSIS — R140 Abdominal distension (gaseous): Secondary | ICD-10-CM

## 2015-02-28 DIAGNOSIS — R109 Unspecified abdominal pain: Secondary | ICD-10-CM

## 2015-02-28 DIAGNOSIS — R112 Nausea with vomiting, unspecified: Secondary | ICD-10-CM | POA: Diagnosis not present

## 2015-02-28 LAB — POCT URINALYSIS DIPSTICK
Bilirubin, UA: NEGATIVE
Glucose, UA: NEGATIVE
Ketones, UA: NEGATIVE
Leukocytes, UA: NEGATIVE
NITRITE UA: NEGATIVE
PH UA: 5.5
PROTEIN UA: NEGATIVE
Spec Grav, UA: <=1.005
Urobilinogen, UA: 0.2

## 2015-02-28 LAB — POCT CBC
GRANULOCYTE PERCENT: 57.8 % (ref 37–80)
HCT, POC: 34.6 % — AB (ref 37.7–47.9)
Hemoglobin: 11.4 g/dL — AB (ref 12.2–16.2)
Lymph, poc: 1.9 (ref 0.6–3.4)
MCH, POC: 27.4 pg (ref 27–31.2)
MCHC: 33 g/dL (ref 31.8–35.4)
MCV: 83 fL (ref 80–97)
MID (CBC): 0.3 (ref 0–0.9)
MPV: 7.4 fL (ref 0–99.8)
PLATELET COUNT, POC: 478 10*3/uL — AB (ref 142–424)
POC GRANULOCYTE: 3 (ref 2–6.9)
POC LYMPH PERCENT: 36.9 %L (ref 10–50)
POC MID %: 5.3 %M (ref 0–12)
RBC: 4.17 M/uL (ref 4.04–5.48)
RDW, POC: 16.7 %
WBC: 5.2 10*3/uL (ref 4.6–10.2)

## 2015-02-28 LAB — POCT UA - MICROSCOPIC ONLY
Casts, Ur, LPF, POC: NEGATIVE
Crystals, Ur, HPF, POC: NEGATIVE
EPITHELIAL CELLS, URINE PER MICROSCOPY: NEGATIVE
Mucus, UA: NEGATIVE
RBC, URINE, MICROSCOPIC: NEGATIVE
WBC, Ur, HPF, POC: NEGATIVE
YEAST UA: NEGATIVE

## 2015-02-28 LAB — POCT URINE PREGNANCY: Preg Test, Ur: NEGATIVE

## 2015-02-28 MED ORDER — TRAMADOL HCL 50 MG PO TABS: 50.0000 mg | ORAL_TABLET | Freq: Four times a day (QID) | ORAL | Status: AC | PRN

## 2015-02-28 MED ORDER — ONDANSETRON 4 MG PO TBDP
4.0000 mg | ORAL_TABLET | Freq: Once | ORAL | Status: AC
  Administered 2015-02-28: 4 mg via ORAL

## 2015-02-28 MED ORDER — ONDANSETRON 4 MG PO TBDP: 4.0000 mg | ORAL_TABLET | Freq: Three times a day (TID) | ORAL | Status: DC | PRN

## 2015-02-28 MED ORDER — GI COCKTAIL ~~LOC~~
30.0000 mL | Freq: Once | ORAL | Status: AC
  Administered 2015-02-28: 30 mL via ORAL

## 2015-02-28 NOTE — Progress Notes (Addendum)
Subjective:  This chart was scribed for Tracy Staggers, MD by [facility], medical scribe at [facility].The patient was seen in exam room 10 and the patient's care was started at 2:58 PM.   Patient ID: Tracy Murphy, female    DOB: Mar 08, 1991, 24 y.o.   MRN: [id number] Chief Complaint  Patient presents with  . Abdominal Pain    rt side has hx of gallbladder-pain worse the past 2 days    HPI HPI Comments: Tracy Murphy is a 24 y.o. female who presents to [facility] complaining of recurrent right upper quadrant abdominal pain, worse over the past two days.   Last seen May 30 th in the ED also with abdominal pain. Given a GI cocktail which resolved her symptoms. Abdominal US was without acute abnormalities. Planned to follow up with gastroenterology. Had a Hepato ordered by Dr. Loreta Ave her gastroenterologist on June 10 th which was normal. She had a CT scan of the abdomen and pelvis May 10 th without acute process and only a noted physiologic cyst with free fluid in left ovary and pelvis. Endoscopy on the 17 th which was normal. Gastroenterologist thinks it is her gallbladder, she was prescribed dexilant and referred to Dr. Magnus Ivan for possible surgery. She has not scheduled an appointment yet.  Today, pt states the pain has worsened over the two days. Laying down, and certain movements worsens her pain. Also complains of heartburn and belching the past couple days. She describes this as a burning sensation. Pt has been nauseous and has vomited. Last episode of vomiting was yesterday evening. She says she does feel bloated today. Last bowel movement was yesterday, which was watery. No fever, but has chills. She takes tylenol four times a day lately, but usually two times a day. She has used phenergan in the past for nausea but she currently does not have any. No change in her food intake. LNMP was 23 days ago. Never been sexually active. No chance in pregnancy.  No history of abdominal bleeding. She denies urinary symptoms, black tarry stool, and trouble breathing.   Lab Results  Component Value Date   ALT 10* 12/29/2014   AST 21 12/29/2014   ALKPHOS 80 12/29/2014   BILITOT 0.3 12/29/2014   Patient Active Problem List   Diagnosis Date Noted  . Migraine 10/11/2013  . Abdominal pain, epigastric 05/02/2013   Past Medical History  Diagnosis Date  . Headache    Past Surgical History  Procedure Laterality Date  . Wisdom tooth extraction     Allergies  Allergen Reactions  . Reglan [Metoclopramide] Anxiety and Other (See Comments)    DYSTONIA   Prior to Admission medications   Medication Sig Start Date End Date Taking? Authorizing Provider  acetaminophen (TYLENOL) 500 MG tablet Take 1,000 mg by mouth every 6 (six) hours as needed (For pain.).   Yes Historical Provider, MD  dexlansoprazole (DEXILANT) 60 MG capsule Take 60 mg by mouth daily.   Yes Historical Provider, MD  docusate sodium (COLACE) 100 MG capsule Take 200 mg by mouth at bedtime.   Yes Historical Provider, MD  omeprazole (PRILOSEC) 40 MG capsule Take 1 capsule (40 mg total) by mouth daily. Patient not taking: Reported on 02/28/2015 12/05/14   Gwenlyn Found Copland, MD  ondansetron (ZOFRAN ODT) 4 MG disintegrating tablet Take 1 tablet (4 mg total) by mouth every 8 (eight) hours as needed for nausea or vomiting. Patient not taking:  Reported on 12/29/2014 12/05/14   Pearline Cables, MD  polyethylene glycol (MIRALAX / GLYCOLAX) packet Take 17 g by mouth daily as needed (For constipation.).     Historical Provider, MD  promethazine (PHENERGAN) 25 MG suppository Place 1 suppository (25 mg total) rectally every 6 (six) hours as needed for nausea or vomiting. Patient not taking: Reported on 12/29/2014 12/09/14   Marisa Severin, MD  promethazine (PHENERGAN) 25 MG tablet Take 1 tablet (25 mg total) by mouth every 6 (six) hours as needed for nausea. Patient not taking: Reported on 02/28/2015 12/09/14   Marisa Severin, MD  sucralfate (CARAFATE) 1 G tablet Take 1 tablet (1 g total) by mouth 4 (four) times daily. Patient not taking: Reported on 02/28/2015 12/09/14   Marisa Severin, MD   History   Social History  . Marital Status: Single    Spouse Name: n/a  . Number of Children: 0  . Years of Education: N/A   Occupational History  . Student     [employer] major   Social History Main Topics  . Smoking status: Never Smoker   . Smokeless tobacco: Never Used  . Alcohol Use: No  . Drug Use: No  . Sexual Activity: No   Other Topics Concern  . Not on file   Social History Narrative   Lives with her parents.   Born in the [place] to [place] parents.  Lived in the [place] x 7 years, and returned to the [place] at age 80.   Review of Systems  Respiratory: Negative for shortness of breath.   Gastrointestinal: Positive for nausea, vomiting, abdominal pain and abdominal distention. Negative for diarrhea and blood in stool.  Genitourinary: Negative for dysuria, hematuria and difficulty urinating.      Objective:  BP 102/68 mmHg  Pulse 104  Temp(Src) 98.5 F (36.9 C) (Oral)  Resp 18  Ht 5\' 5"  (1.651 m)  Wt 140 lb (63.504 kg)  BMI 23.30 kg/m2  SpO2 99%  LMP 02/21/2015 Physical Exam  Constitutional: She is oriented to person, place, and time. She appears well-developed and well-nourished. No distress.  HENT:  Head: Normocephalic and atraumatic.  Eyes: Pupils are equal, round, and reactive to light.  Neck: Normal range of motion.  Cardiovascular: Normal rate and regular rhythm.   Pulmonary/Chest: Effort normal. No respiratory distress.  Abdominal: There is CVA tenderness. There is no tenderness at McBurney's point and negative Murphy's sign.  Slightly hyperactive bowel sounds. RUQ TTP, no appreciable distention. Guarding with right CVA tenderness.  Musculoskeletal: Normal range of motion.  Neurological: She is alert and oriented to person, place, and time.  Skin: Skin is warm and dry.   Psychiatric: She has a normal mood and affect. Her behavior is normal.  Nursing note and vitals reviewed. Controlled substance reporting system reviewed no listing past one year. [facility] reading (PRIMARY) by Dr. Neva Seat- Acute abdominal series no specific bowel gas findings. No free air. No acute findings  Results for orders placed or performed in visit on 02/28/15  POCT CBC  Result Value Ref Range   WBC 5.2 4.6 - 10.2 K/uL   Lymph, poc 1.9 0.6 - 3.4   POC LYMPH PERCENT 36.9 10 - 50 %L   MID (cbc) 0.3 0 - 0.9   POC MID % 5.3 0 - 12 %M   POC Granulocyte 3.0 2 - 6.9   Granulocyte percent 57.8 37 - 80 %G   RBC 4.17 4.04 - 5.48 M/uL   Hemoglobin 11.4 (A)  12.2 - 16.2 g/dL   HCT, POC 16.1 (A) 09.6 - 47.9 %   MCV 83.0 80 - 97 fL   MCH, POC 27.4 27 - 31.2 pg   MCHC 33.0 31.8 - 35.4 g/dL   RDW, POC 04.5 %   Platelet Count, POC 478 (A) 142 - 424 K/uL   MPV 7.4 0 - 99.8 fL  POCT urinalysis dipstick  Result Value Ref Range   Color, UA yellow    Clarity, UA clear    Glucose, UA neg    Bilirubin, UA neg    Ketones, UA neg    Spec Grav, UA <=1.005    Blood, UA trace-intact    pH, UA 5.5    Protein, UA neg    Urobilinogen, UA 0.2    Nitrite, UA neg    Leukocytes, UA Negative Negative  POCT urine pregnancy  Result Value Ref Range   Preg Test, Ur Negative Negative  POCT UA - Microscopic Only  Result Value Ref Range   WBC, Ur, HPF, POC neg    RBC, urine, microscopic neg    Bacteria, U Microscopic trace    Mucus, UA neg    Epithelial cells, urine per micros neg    Crystals, Ur, HPF, POC neg    Casts, Ur, LPF, POC neg    Yeast, UA neg     4:28 PM Recheck after Zofran and GI cocktail given. Less heartburn, still having pain and upper part of abdomen. Nausea has resolved.    Assessment & Plan:   SEASON ASTACIO is a 24 y.o. female RUQ abdominal pain - Plan: COMPLETE METABOLIC PANEL WITH GFR, POCT CBC, DG Abd Acute W/Chest, gi cocktail (Maalox,Lidocaine,Donnatal)  Abdominal bloating  - Plan: COMPLETE METABOLIC PANEL WITH GFR, POCT CBC, DG Abd Acute W/Chest  Non-intractable vomiting with nausea, vomiting of unspecified type - Plan: ondansetron (ZOFRAN-ODT) disintegrating tablet 4 mg, ondansetron (ZOFRAN ODT) 4 MG disintegrating tablet  Heartburn - Plan: gi cocktail (Maalox,Lidocaine,Donnatal), CANCELED: GI COCKTAIL UP TO 45 CC  Right flank pain - Plan: POCT urinalysis dipstick, POCT urine pregnancy, POCT UA - Microscopic Only  Right upper quadrant abdominal pain, similar to symptoms in past, along with heartburn and nausea/vomiting. She has had significant workup with ultrasound, CT scan, and HIDA scan under care of her gastroenterologist.  She is planning on following up with general surgery, but has not made appointment at this time. Her CBC, abdominal x-ray, urine testing and vital signs are all reassuring in office. Doubt kidney stone based on location of symptoms and no RBCs on urine micro.   -Improved nausea/vomiting and heartburn in office with GI cocktail and Zofran 1.  -Okay to continue Zofran if needed for nausea vomiting at home, #10 given. Side effects discussed including constipation.  -Short course of tramadol if needed for breakthrough pain overnight, but if pain continues into tomorrow or at the latest Monday, recommended follow-up with gastroenterologist, here or emergency room. Sooner if worse. Understanding expressed  Meds ordered this encounter  Medications  . ondansetron (ZOFRAN-ODT) disintegrating tablet 4 mg    Sig:   . ondansetron (ZOFRAN ODT) 4 MG disintegrating tablet    Sig: Take 1 tablet (4 mg total) by mouth every 8 (eight) hours as needed for nausea or vomiting.    Dispense:  10 tablet    Refill:  0  . gi cocktail (Maalox,Lidocaine,Donnatal)    Sig:   . traMADol (ULTRAM) 50 MG tablet    Sig: Take 1 tablet (50  mg total) by mouth every 6 (six) hours as needed.    Dispense:  10 tablet    Refill:  0   Patient Instructions  Your infection  fighting cells in your blood were normal. Urine tests overall appears normal, and pregnancy test was negative. I am checking some liver tests but those results will be back for a few days possibly. You can take the Zofran if needed for nausea, bland foods and make sure drinking plenty of liquids. If you need something stronger for pain, I did write a few tramadol. But if your pain is not improving in the next 2 days follow-up here emergency room, or your gastroenterologist Monday.  Your hemoglobin or blood count for anemia was borderline low today. Would recommend we recheck this next week with [place] or your gastroenterologist. If you do get lightheaded, dizzy, or worsening symptoms as we discussed above, follow-up here or the emergency room.  Return to the clinic or go to the nearest emergency room if any of your symptoms worsen or new symptoms occur.   Abdominal Pain Many things can cause abdominal pain. Usually, abdominal pain is not caused by a disease and will improve without treatment. It can often be observed and treated at home. Your health care provider will do a physical exam and possibly order blood tests and X-rays to help determine the seriousness of your pain. However, in many cases, more time must pass before a clear cause of the pain can be found. Before that point, your health care provider may not know if you need more testing or further treatment. HOME CARE INSTRUCTIONS  Monitor your abdominal pain for any changes. The following actions may help to alleviate any discomfort you are experiencing:  Only take over-the-counter or prescription medicines as directed by your health care provider.  Do not take laxatives unless directed to do so by your health care provider.  Try a clear liquid diet (broth, tea, or water) as directed by your health care provider. Slowly move to a bland diet as tolerated. SEEK MEDICAL CARE IF:  You have unexplained abdominal pain.  You have abdominal pain  associated with nausea or diarrhea.  You have pain when you urinate or have a bowel movement.  You experience abdominal pain that wakes you in the night.  You have abdominal pain that is worsened or improved by eating food.  You have abdominal pain that is worsened with eating fatty foods.  You have a fever. SEEK IMMEDIATE MEDICAL CARE IF:   Your pain does not go away within 2 hours.  You keep throwing up (vomiting).  Your pain is felt only in portions of the abdomen, such as the right side or the left lower portion of the abdomen.  You pass bloody or black tarry stools. MAKE SURE YOU:  Understand these instructions.   Will watch your condition.   Will get help right away if you are not doing well or get worse.  Document Released: 04/27/2005 Document Revised: 07/23/2013 Document Reviewed: 03/27/2013 [facility] Patient Information 2015 [place], [place]. This information is not intended to replace advice given to you by your health care provider. Make sure you discuss any questions you have with your health care provider.       I personally performed the services described in this documentation, which was scribed in my presence. The recorded information has been reviewed and considered, and addended by me as needed.

## 2015-02-28 NOTE — Patient Instructions (Signed)
Your infection fighting cells in your blood were normal. Urine tests overall appears normal, and pregnancy test was negative. I am checking some liver tests but those results will be back for a few days possibly. You can take the Zofran if needed for nausea, bland foods and make sure drinking plenty of liquids. If you need something stronger for pain, I did write a few tramadol. But if your pain is not improving in the next 2 days follow-up here emergency room, or your gastroenterologist Monday.  Your hemoglobin or blood count for anemia was borderline low today. Would recommend we recheck this next week with [place] or your gastroenterologist. If you do get lightheaded, dizzy, or worsening symptoms as we discussed above, follow-up here or the emergency room.  Return to the clinic or go to the nearest emergency room if any of your symptoms worsen or new symptoms occur.   Abdominal Pain Many things can cause abdominal pain. Usually, abdominal pain is not caused by a disease and will improve without treatment. It can often be observed and treated at home. Your health care provider will do a physical exam and possibly order blood tests and X-rays to help determine the seriousness of your pain. However, in many cases, more time must pass before a clear cause of the pain can be found. Before that point, your health care provider may not know if you need more testing or further treatment. HOME CARE INSTRUCTIONS  Monitor your abdominal pain for any changes. The following actions may help to alleviate any discomfort you are experiencing:  Only take over-the-counter or prescription medicines as directed by your health care provider.  Do not take laxatives unless directed to do so by your health care provider.  Try a clear liquid diet (broth, tea, or water) as directed by your health care provider. Slowly move to a bland diet as tolerated. SEEK MEDICAL CARE IF:  You have unexplained abdominal pain.  You have  abdominal pain associated with nausea or diarrhea.  You have pain when you urinate or have a bowel movement.  You experience abdominal pain that wakes you in the night.  You have abdominal pain that is worsened or improved by eating food.  You have abdominal pain that is worsened with eating fatty foods.  You have a fever. SEEK IMMEDIATE MEDICAL CARE IF:   Your pain does not go away within 2 hours.  You keep throwing up (vomiting).  Your pain is felt only in portions of the abdomen, such as the right side or the left lower portion of the abdomen.  You pass bloody or black tarry stools. MAKE SURE YOU:  Understand these instructions.   Will watch your condition.   Will get help right away if you are not doing well or get worse.  Document Released: 04/27/2005 Document Revised: 07/23/2013 Document Reviewed: 03/27/2013 [facility] Patient Information 2015 [place], [place]. This information is not intended to replace advice given to you by your health care provider. Make sure you discuss any questions you have with your health care provider.

## 2015-03-01 LAB — COMPLETE METABOLIC PANEL WITH GFR
ALT: 9 U/L (ref 6–29)
AST: 18 U/L (ref 10–30)
Albumin: 4.6 g/dL (ref 3.6–5.1)
Alkaline Phosphatase: 77 U/L (ref 33–115)
BILIRUBIN TOTAL: 0.4 mg/dL (ref 0.2–1.2)
BUN: 4 mg/dL — ABNORMAL LOW (ref 7–25)
CO2: 26 mmol/L (ref 20–31)
Calcium: 9.6 mg/dL (ref 8.6–10.2)
Chloride: 100 mmol/L (ref 98–110)
Creat: 0.61 mg/dL (ref 0.50–1.10)
GFR, Est African American: >89 mL/min (ref >=60)
GLUCOSE: 90 mg/dL (ref 65–99)
Potassium: 4 mmol/L (ref 3.5–5.3)
SODIUM: 134 mmol/L — AB (ref 135–146)
Total Protein: 7.8 g/dL (ref 6.1–8.1)

## 2015-09-11 ENCOUNTER — Encounter (HOSPITAL_COMMUNITY): Payer: Self-pay | Admitting: Oncology

## 2015-09-11 ENCOUNTER — Emergency Department (HOSPITAL_COMMUNITY): Payer: BLUE CROSS/BLUE SHIELD

## 2015-09-11 ENCOUNTER — Emergency Department (HOSPITAL_COMMUNITY)
Admission: EM | Admit: 2015-09-11 | Discharge: 2015-09-12 | Disposition: A | Discharge status: Home / Self Care | Payer: BLUE CROSS/BLUE SHIELD | Attending: Emergency Medicine | Admitting: Emergency Medicine

## 2015-09-11 DIAGNOSIS — R002 Palpitations: Secondary | ICD-10-CM | POA: Diagnosis not present

## 2015-09-11 DIAGNOSIS — R05 Cough: Secondary | ICD-10-CM | POA: Insufficient documentation

## 2015-09-11 DIAGNOSIS — R112 Nausea with vomiting, unspecified: Secondary | ICD-10-CM | POA: Diagnosis not present

## 2015-09-11 DIAGNOSIS — F419 Anxiety disorder, unspecified: Secondary | ICD-10-CM | POA: Diagnosis not present

## 2015-09-11 DIAGNOSIS — Z3202 Encounter for pregnancy test, result negative: Secondary | ICD-10-CM | POA: Diagnosis not present

## 2015-09-11 DIAGNOSIS — R0602 Shortness of breath: Secondary | ICD-10-CM | POA: Diagnosis not present

## 2015-09-11 DIAGNOSIS — R000 Tachycardia, unspecified: Secondary | ICD-10-CM | POA: Diagnosis not present

## 2015-09-11 HISTORY — DX: Anxiety disorder, unspecified: F41.9

## 2015-09-11 LAB — CBC WITH DIFFERENTIAL/PLATELET
BASOS ABS: 0 10*3/uL (ref 0.0–0.1)
Basophils Relative: 0 %
EOS PCT: 1 %
Eosinophils Absolute: 0 10*3/uL (ref 0.0–0.7)
HCT: 32 % — ABNORMAL LOW (ref 36.0–46.0)
Hemoglobin: 10.3 g/dL — ABNORMAL LOW (ref 12.0–15.0)
LYMPHS PCT: 32 %
Lymphs Abs: 1.8 10*3/uL (ref 0.7–4.0)
MCH: 26.6 pg (ref 26.0–34.0)
MCHC: 32.2 g/dL (ref 30.0–36.0)
MCV: 82.7 fL (ref 78.0–100.0)
MONO ABS: 0.6 10*3/uL (ref 0.1–1.0)
MONOS PCT: 11 %
Neutro Abs: 3.1 10*3/uL (ref 1.7–7.7)
Neutrophils Relative %: 56 %
PLATELETS: 408 10*3/uL — AB (ref 150–400)
RBC: 3.87 MIL/uL (ref 3.87–5.11)
RDW: 13.9 % (ref 11.5–15.5)
WBC: 5.6 10*3/uL (ref 4.0–10.5)

## 2015-09-11 LAB — I-STAT TROPONIN, ED: Troponin i, poc: 0 ng/mL (ref 0.00–0.08)

## 2015-09-11 LAB — BASIC METABOLIC PANEL
ANION GAP: 8 (ref 5–15)
BUN: <5 mg/dL — ABNORMAL LOW (ref 6–20)
CALCIUM: 9 mg/dL (ref 8.9–10.3)
CO2: 24 mmol/L (ref 22–32)
CREATININE: 0.57 mg/dL (ref 0.44–1.00)
Chloride: 105 mmol/L (ref 101–111)
GFR calc Af Amer: >60 mL/min (ref >60)
GLUCOSE: 118 mg/dL — AB (ref 65–99)
Potassium: 3.6 mmol/L (ref 3.5–5.1)
Sodium: 137 mmol/L (ref 135–145)

## 2015-09-11 LAB — I-STAT BETA HCG BLOOD, ED (MC, WL, AP ONLY): I-stat hCG, quantitative: <5 m[IU]/mL (ref <5)

## 2015-09-11 LAB — TSH: TSH: 0.837 u[IU]/mL (ref 0.350–4.500)

## 2015-09-11 MED ORDER — SODIUM CHLORIDE 0.9 % IV BOLUS (SEPSIS)
1000.0000 mL | Freq: Once | INTRAVENOUS | Status: AC
  Administered 2015-09-11: 1000 mL via INTRAVENOUS

## 2015-09-11 MED ORDER — ONDANSETRON HCL 4 MG/2ML IJ SOLN
4.0000 mg | INTRAMUSCULAR | Status: AC
  Administered 2015-09-11: 4 mg via INTRAVENOUS
  Filled 2015-09-11: qty 2

## 2015-09-11 NOTE — ED Provider Notes (Signed)
CSN: [id number]     Arrival date & time 09/11/15  2032 History   First MD Initiated Contact with Patient 09/11/15 2134     Chief Complaint  Patient presents with  . Tachycardia    HPI   Tracy Murphy is a 25 y.o. female with a PMH of headaches, anxiety who presents to the ED with N/V and intermittent tachycardia x 1 week. She reports her symptoms come and go, and notes she feels nauseous and vomits following meals, which she states seems to precipitate her tachycardia. She notes she feels like her heart is pounding and reports associated shortness of breath. She also states she has had a cough productive of yellow sputum. She denies fever, chills, headache, lightheadedness, dizziness, chest pain, abdominal pain, hematemesis, diarrhea, constipation, numbness, weakness, paresthesia, recent travel or immobility, recent surgery, history of DVT or PE, history of malignancy, estrogen use.   Past Medical History  Diagnosis Date  . Headache   . Anxiety    Past Surgical History  Procedure Laterality Date  . Wisdom tooth extraction     Family History  Problem Relation Age of Onset  . Diabetes Father   . Heart disease Father   . Hypertension Father   . Migraines Father   . Hyperlipidemia Father    Social History  Substance Use Topics  . Smoking status: Never Smoker   . Smokeless tobacco: Never Used  . Alcohol Use: No   OB History    No data available      Review of Systems  Constitutional: Negative for fever and chills.  Respiratory: Positive for cough and shortness of breath.   Cardiovascular: Positive for palpitations. Negative for chest pain and leg swelling.  Gastrointestinal: Positive for nausea and vomiting. Negative for abdominal pain, diarrhea and constipation.  Neurological: Negative for dizziness, syncope, weakness, light-headedness, numbness and headaches.  All other systems reviewed and are negative.     Allergies  Reglan  Home Medications   Prior to Admission  medications   Medication Sig Start Date End Date Taking? Authorizing Provider  acetaminophen (TYLENOL) 500 MG tablet Take 1,000 mg by mouth every 6 (six) hours as needed (For pain.).   Yes Historical Provider, MD  omeprazole (PRILOSEC) 40 MG capsule Take 40 mg by mouth 2 (two) times daily as needed (acid reflux).   Yes Historical Provider, MD  ondansetron (ZOFRAN ODT) 4 MG disintegrating tablet Take 1 tablet (4 mg total) by mouth every 8 (eight) hours as needed for nausea or vomiting. Patient not taking: Reported on 09/11/2015 02/28/15   Shade Flood, MD  traMADol (ULTRAM) 50 MG tablet Take 1 tablet (50 mg total) by mouth every 6 (six) hours as needed. Patient not taking: Reported on 09/11/2015 02/28/15   Shade Flood, MD    BP 101/66 mmHg  Pulse 83  Temp(Src) 98.4 F (36.9 C) (Oral)  Resp 12  SpO2 99% Physical Exam  Constitutional: She is oriented to person, place, and time. She appears well-developed and well-nourished. No distress.  HENT:  Head: Normocephalic and atraumatic.  Right Ear: External ear normal.  Left Ear: External ear normal.  Nose: Nose normal.  Mouth/Throat: Uvula is midline, oropharynx is clear and moist and mucous membranes are normal.  Eyes: Conjunctivae, EOM and lids are normal. Pupils are equal, round, and reactive to light. Right eye exhibits no discharge. Left eye exhibits no discharge. No scleral icterus.  Neck: Normal range of motion. Neck supple.  Cardiovascular: Normal rate, regular rhythm,  normal heart sounds, intact distal pulses and normal pulses.   Pulmonary/Chest: Effort normal and breath sounds normal. No respiratory distress. She has no wheezes. She has no rales.  Abdominal: Soft. Normal appearance and bowel sounds are normal. She exhibits no distension and no mass. There is no tenderness. There is no rigidity, no rebound and no guarding.  Musculoskeletal: Normal range of motion. She exhibits no edema or tenderness.  Neurological: She is alert and  oriented to person, place, and time. She has normal strength. No cranial nerve deficit or sensory deficit.  Skin: Skin is warm, dry and intact. No rash noted. She is not diaphoretic. No erythema. No pallor.  Psychiatric: She has a normal mood and affect. Her speech is normal and behavior is normal.  Nursing note and vitals reviewed.   ED Course  Procedures (including critical care time)  Labs Review Labs Reviewed  CBC WITH DIFFERENTIAL/PLATELET - Abnormal; Notable for the following:    Hemoglobin 10.3 (*)    HCT 32.0 (*)    Platelets 408 (*)    All other components within normal limits  BASIC METABOLIC PANEL - Abnormal; Notable for the following:    Glucose, Bld 118 (*)    BUN <5 (*)    All other components within normal limits  TSH  URINALYSIS, ROUTINE W REFLEX MICROSCOPIC (NOT AT [facility])  D-DIMER, QUANTITATIVE (NOT AT [facility])  I-STAT TROPOININ, ED  I-STAT BETA HCG BLOOD, ED ([facility], [facility], [facility] ONLY)    Imaging Review Dg Chest 2 View  09/11/2015  CLINICAL DATA:  Dizziness, trembling and tachycardia. EXAM: CHEST  2 VIEW COMPARISON:  Chest x-ray dated 02/28/2015. FINDINGS: The heart size and mediastinal contours are within normal limits. Both lungs are clear. The visualized skeletal structures are unremarkable. IMPRESSION: No active cardiopulmonary disease. Electronically Signed   By: Bary Richard M.D.   On: 09/11/2015 21:21   I have personally reviewed and evaluated these images and lab results as part of my medical decision-making.   EKG Interpretation   Date/Time:  Friday September 11 2015 20:40:23 EST Ventricular Rate:  120 PR Interval:  122 QRS Duration: 69 QT Interval:  306 QTC Calculation: 432 R Axis:   73 Text Interpretation:  Sinus tachycardia Borderline repolarization  abnormality No significant change since last tracing Reconfirmed by KNOTT  MD, DANIEL ([id number]) on 09/11/2015 8:43:49 PM      MDM   Final diagnoses:  Tachycardia  Non-intractable vomiting with nausea,  vomiting of unspecified type  Shortness of breath    25 year old female presents with tachycardia for the past week. Also notes nausea, vomiting, and shortness of breath. States she has a history of anxiety, though reports her symptoms feel different. No recent travel or immobility, recent surgery, history of malignancy, history of DVT or PE, estrogen use.   Patient is afebrile. Initially tachycardic to 109. Heart regular rhythm. Lungs clear to auscultation bilaterally. Abdomen soft, nontender, nondistended. Normal neuro exam with no focal deficit.   Patient given fluids.  CBC negative for leukocytosis, hemoglobin 10.3. BMP unremarkable. Urinalysis negative for infection. Beta hCG negative. EKG sinus tachycardia, heart rate 120. Troponin negative. Chest x-ray no active cardiopulmonary disease. D-dimer pending.  On reassessment of patient, she reports symptom improvement. HR improved to 80s.  D-dimer negative. Patient is nontoxic and well-appearing, feel she is stable for discharge at this time. Low suspicion for ACS or PE given unremarkable workup in the ED. Patient to follow-up PCP for further evaluation and management. Return precautions discussed. Patient  verbalizes her understanding and is in agreement with plan.  BP 101/66 mmHg  Pulse 83  Temp(Src) 98.4 F (36.9 C) (Oral)  Resp 12  SpO2 99%     Mady Gemma, PA-C 09/12/15 0253  Lyndal Pulley, MD 09/12/15 [phone]

## 2015-09-11 NOTE — ED Notes (Signed)
Pt presents w/ family d/t Shob, dizziness, trembling and tachycardia.  Pt is not speaking in triage, family is speaking for her.  RR is shallow.

## 2015-09-12 LAB — URINALYSIS, ROUTINE W REFLEX MICROSCOPIC
BILIRUBIN URINE: NEGATIVE
GLUCOSE, UA: NEGATIVE mg/dL
HGB URINE DIPSTICK: NEGATIVE
KETONES UR: NEGATIVE mg/dL
Leukocytes, UA: NEGATIVE
Nitrite: NEGATIVE
PROTEIN: NEGATIVE mg/dL
Specific Gravity, Urine: 1.011 (ref 1.005–1.030)
pH: 8 (ref 5.0–8.0)

## 2015-09-12 LAB — D-DIMER, QUANTITATIVE (NOT AT ARMC): D DIMER QUANT: 0.28 ug{FEU}/mL (ref 0.00–0.50)

## 2015-09-12 MED ORDER — ACETAMINOPHEN 325 MG PO TABS
650.0000 mg | ORAL_TABLET | Freq: Once | ORAL | Status: AC
  Administered 2015-09-12: 650 mg via ORAL
  Filled 2015-09-12: qty 2

## 2015-09-12 NOTE — ED Notes (Signed)
Patient d/c'd self care with family.  F/U reviewed.  Patient verbalized understanding.

## 2015-09-12 NOTE — Discharge Instructions (Signed)
1. Medications: usual home medications 2. Treatment: rest, drink plenty of fluids 3. Follow Up: please followup with your primary doctor for discussion of your diagnoses and further evaluation after today's visit; if you do not have a primary care doctor use the resource guide provided to find one; please return to the ER for new or worsening symptoms   Nausea and Vomiting Nausea means you feel sick to your stomach. Throwing up (vomiting) is a reflex where stomach contents come out of your mouth. HOME CARE   Take medicine as told by your doctor.  Do not force yourself to eat. However, you do need to drink fluids.  If you feel like eating, eat a normal diet as told by your doctor.  Eat rice, wheat, potatoes, bread, lean meats, yogurt, fruits, and vegetables.  Avoid high-fat foods.  Drink enough fluids to keep your pee (urine) clear or pale yellow.  Ask your doctor how to replace body fluid losses (rehydrate). Signs of body fluid loss (dehydration) include:  Feeling very thirsty.  Dry lips and mouth.  Feeling dizzy.  Dark pee.  Peeing less than normal.  Feeling confused.  Fast breathing or heart rate. GET HELP RIGHT AWAY IF:   You have blood in your throw up.  You have black or bloody poop (stool).  You have a bad headache or stiff neck.  You feel confused.  You have bad belly (abdominal) pain.  You have chest pain or trouble breathing.  You do not pee at least once every 8 hours.  You have cold, clammy skin.  You keep throwing up after 24 to 48 hours.  You have a fever. MAKE SURE YOU:   Understand these instructions.  Will watch your condition.  Will get help right away if you are not doing well or get worse.   This information is not intended to replace advice given to you by your health care provider. Make sure you discuss any questions you have with your health care provider.   Document Released: 01/04/2008 Document Revised: 10/10/2011 Document  Reviewed: 12/17/2010 Elsevier Interactive Patient Education 2016 Elsevier Inc.  Nonspecific Tachycardia Tachycardia is a faster than normal heartbeat (more than 100 beats per minute). In adults, the heart normally beats between 60 and 100 times a minute. A fast heartbeat may be a normal response to exercise or stress. It does not necessarily mean that something is wrong. However, sometimes when your heart beats too fast it may not be able to pump enough blood to the rest of your body. This can result in chest pain, shortness of breath, dizziness, and even fainting. Nonspecific tachycardia means that the specific cause or pattern of your tachycardia is unknown. CAUSES  Tachycardia may be harmless or it may be due to a more serious underlying cause. Possible causes of tachycardia include:  Exercise or exertion.  Fever.  Pain or injury.  Infection.  Loss of body fluids (dehydration).  Overactive thyroid.  Lack of red blood cells (anemia).  Anxiety and stress.  Alcohol.  Caffeine.  Tobacco products.  Diet pills.  Illegal drugs.  Heart disease. SYMPTOMS  Rapid or irregular heartbeat (palpitations).  Suddenly feeling your heart beating (cardiac awareness).  Dizziness.  Tiredness (fatigue).  Shortness of breath.  Chest pain.  Nausea.  Fainting. DIAGNOSIS  Your caregiver will perform a physical exam and take your medical history. In some cases, a heart specialist (cardiologist) may be consulted. Your caregiver may also order:  Blood tests.  Electrocardiography. This test records  the electrical activity of your heart.  A heart monitoring test. TREATMENT  Treatment will depend on the likely cause of your tachycardia. The goal is to treat the underlying cause of your tachycardia. Treatment methods may include:  Replacement of fluids or blood through an intravenous (IV) tube for moderate to severe dehydration or anemia.  New medicines or changes in your current  medicines.  Diet and lifestyle changes.  Treatment for certain infections.  Stress relief or relaxation methods. HOME CARE INSTRUCTIONS   Rest.  Drink enough fluids to keep your urine clear or pale yellow.  Do not smoke.  Avoid:  Caffeine.  Tobacco.  Alcohol.  Chocolate.  Stimulants such as over-the-counter diet pills or pills that help you stay awake.  Situations that cause anxiety or stress.  Illegal drugs such as marijuana, phencyclidine (PCP), and cocaine.  Only take medicine as directed by your caregiver.  Keep all follow-up appointments as directed by your caregiver. SEEK IMMEDIATE MEDICAL CARE IF:   You have pain in your chest, upper arms, jaw, or neck.  You become weak, dizzy, or feel faint.  You have palpitations that will not go away.  You vomit, have diarrhea, or pass blood in your stool.  Your skin is cool, pale, and wet.  You have a fever that will not go away with rest, fluids, and medicine. MAKE SURE YOU:   Understand these instructions.  Will watch your condition.  Will get help right away if you are not doing well or get worse.   This information is not intended to replace advice given to you by your health care provider. Make sure you discuss any questions you have with your health care provider.   Document Released: 08/25/2004 Document Revised: 10/10/2011 Document Reviewed: 01/30/2015 Elsevier Interactive Patient Education 2016 [employer].  Shortness of Breath Shortness of breath means you have trouble breathing. It could also mean that you have a medical problem. You should get immediate medical care for shortness of breath. CAUSES   Not enough oxygen in the air such as with high altitudes or a smoke-filled room.  Certain lung diseases, infections, or problems.  Heart disease or conditions, such as angina or heart failure.  Low red blood cells (anemia).  Poor physical fitness, which can cause shortness of breath when you  exercise.  Chest or back injuries or stiffness.  Being overweight.  Smoking.  Anxiety, which can make you feel like you are not getting enough air. DIAGNOSIS  Serious medical problems can often be found during your physical exam. Tests may also be done to determine why you are having shortness of breath. Tests may include:  Chest X-rays.  Lung function tests.  Blood tests.  An electrocardiogram (ECG).  An ambulatory electrocardiogram. An ambulatory ECG records your heartbeat patterns over a 24-hour period.  Exercise testing.  A transthoracic echocardiogram (TTE). During echocardiography, sound waves are used to evaluate how blood flows through your heart.  A transesophageal echocardiogram (TEE).  Imaging scans. Your health care provider may not be able to find a cause for your shortness of breath after your exam. In this case, it is important to have a follow-up exam with your health care provider as directed.  TREATMENT  Treatment for shortness of breath depends on the cause of your symptoms and can vary greatly. HOME CARE INSTRUCTIONS   Do not smoke. Smoking is a common cause of shortness of breath. If you smoke, ask for help to quit.  Avoid being around chemicals  or things that may bother your breathing, such as paint fumes and dust.  Rest as needed. Slowly resume your usual activities.  If medicines were prescribed, take them as directed for the full length of time directed. This includes oxygen and any inhaled medicines.  Keep all follow-up appointments as directed by your health care provider. SEEK MEDICAL CARE IF:   Your condition does not improve in the time expected.  You have a hard time doing your normal activities even with rest.  You have any new symptoms. SEEK IMMEDIATE MEDICAL CARE IF:   Your shortness of breath gets worse.  You feel light-headed, faint, or develop a cough not controlled with medicines.  You start coughing up blood.  You have  pain with breathing.  You have chest pain or pain in your arms, shoulders, or abdomen.  You have a fever.  You are unable to walk up stairs or exercise the way you normally do. MAKE SURE YOU:  Understand these instructions.  Will watch your condition.  Will get help right away if you are not doing well or get worse.   This information is not intended to replace advice given to you by your health care provider. Make sure you discuss any questions you have with your health care provider.   Document Released: 04/12/2001 Document Revised: 07/23/2013 Document Reviewed: 10/03/2011 Elsevier Interactive Patient Education 2016 [employer].   Emergency Department Resource Guide 1) Find a Doctor and Pay Out of Pocket Although you won't have to find out who is covered by your insurance plan, it is a good idea to ask around and get recommendations. You will then need to call the office and see if the doctor you have chosen will accept you as a new patient and what types of options they offer for patients who are self-pay. Some doctors offer discounts or will set up payment plans for their patients who do not have insurance, but you will need to ask so you aren't surprised when you get to your appointment.  2) Contact Your Local Health Department Not all health departments have doctors that can see patients for sick visits, but many do, so it is worth a call to see if yours does. If you don't know where your local health department is, you can check in your phone book. The CDC also has a tool to help you locate your state's health department, and many state websites also have listings of all of their local health departments.  3) Find a Walk-in Clinic If your illness is not likely to be very severe or complicated, you may want to try a walk in clinic. These are popping up all over the country in pharmacies, drugstores, and shopping centers. They're usually staffed by nurse practitioners or physician  assistants that have been trained to treat common illnesses and complaints. They're usually fairly quick and inexpensive. However, if you have serious medical issues or chronic medical problems, these are probably not your best option.  No Primary Care Doctor: - Call Health Connect at  [phone] - they can help you locate a primary care doctor that  accepts your insurance, provides certain services, etc. - Physician Referral Service- [phone]  Chronic Pain Problems: Organization         Address  Phone   Notes  [facility]  [phone] Patients need to be referred by their primary care doctor.   Medication Assistance: Organization         Address  Phone   Notes  [facility] Medication [facility] [address] [place] [place], [place] [address] [phone] --Must be a resident of [facility] -- Must have NO insurance coverage whatsoever (no Medicaid/ Medicare, etc.) -- The pt. MUST have a primary care doctor that directs their care regularly and follows them in the community   [facility]  [phone]   [employer]  [phone]    Agencies that provide inexpensive medical care: Organization         Address  Phone   Notes  [facility]  [phone]   [facility]    [phone]   [facility] [address] [place], [place] [address] [phone]   [facility] [address], [place] [phone]   Planned Parenthood    [phone]   [facility]    [phone]   [facility]  [address], [place] Phone:  [phone], Fax:  [phone] Hours of Operation:  9 am - 6 pm, M-F.  Also accepts Medicaid/Medicare and self-pay.  [facility]  [address], [place] Phone: [phone], Fax: [phone]. Hours of Operation:  8:30 am - 5:30 pm, M-F.  Also accepts  Medicaid and self-pay.  [facility] [place] [address] [place] Phone: [phone]   [facility] [address] Natasha Bence [place], [place] [phone], Ext. [phone] Mondays & Thursdays: 7-9 AM.  First 15 patients are seen on a first come, first serve basis.    Medicaid-accepting [facility] Providers:  Organization         Address  Phone   Notes  [facility] [address], [place] [phone] Also accepts self-pay patients.  [facility] [address] Laurell Josephs [place], [place]  [phone]   [facility] [address], [place] [phone]   [facility] [address], [place] [phone]   Renaye Rakers [address], [place]   [phone] Only accepts [place] Access [place] patients after they have their name applied to their card.   Self-Pay (no insurance) in [facility]:  Organization         Address  Phone   Notes  Sickle Cell Patients, [facility] Internal Medicine [address] [place], [place] [phone]   [facility] [address] [place], [place] [phone]   [facility] [place]  [address], [place] [phone]   [facility]/Dr. Osei-Bonsu  [address], [place] or [address], [place] [phone] Phone number for both [place] and [place] locations is the same.  [facility] [address] [place] [phone]   [facility] [address] [place] or [address] [phone]   [facility] [address], [place] [phone], phone; [phone], fax Sees patients 1st and 3rd Saturday of every month.  Must not qualify for public or private insurance (i.e. Medicaid, Medicare, [place] Health Choice, Veterans' Benefits)  Household  income should be no more than 200% of the poverty level The clinic cannot treat you if you are pregnant or think you are pregnant  Sexually transmitted diseases are not treated at the clinic.    Dental Care: Organization         Address  Phone  Notes  [facility] Department of [employer] [facility] [address] [place], [place] [phone] Accepts children up to age 40 who are enrolled in [place] or [place] Health Choice; pregnant women with a Medicaid card; and children who have applied for Medicaid or [place] Health Choice, but were declined, whose parents can pay a reduced fee at time of service.  [facility] of [facility]  [address], [place] [phone] Accepts children up to age 34 who are enrolled in [place] or [place] Health Choice; pregnant women with a Medicaid card; and children who have applied for Medicaid or [place] Health Choice, but were declined, whose parents can pay a reduced fee at time of service.  [facility]  [address] [place], [place] [phone] Patients are seen by appointment only. Walk-ins are not accepted. [facility] will see patients 64 years of age and older. Monday - Tuesday (8am-5pm) Most Wednesdays (8:30-5pm) $30 per visit, cash only  [facility]  [address], [facility] [phone] Patients are seen by appointment only. Walk-ins are not accepted. [facility] will see patients 64 years of age and older. One Wednesday Evening (Monthly: Volunteer Based).  $30 per visit, cash only  [employer]  [phone] for adults; Children under age 19, call Graduate Pediatric Dentistry at [phone]. Children aged 72-14, please call [phone] to request a pediatric application.  Dental services are provided in all areas of dental care including fillings, crowns and bridges, complete and partial dentures, implants, gum  treatment, root canals, and extractions. Preventive care is also provided. Treatment is provided to both adults and children. Patients are selected via a lottery and there is often a waiting list.   [facility] [address], [place]  [phone] [email or web address]   [facility] [address] [place], [place] [phone], Ext. [phone] Second and Fourth Thursday of each month, opens at 6:30 AM; Clinic ends at 9 AM.  Patients are seen on a first-come first-served basis, and a limited number are seen during each clinic.   [facility]  [address] Ether Griffins [place], [place] [phone]   Eligibility Requirements You must have lived in [place], [place], or [place] counties for at least the last three months.   You cannot be eligible for state or federal sponsored [place], including [employer], [place], or [employer].   You generally cannot be eligible for healthcare insurance through your employer.    How to apply: Eligibility screenings are held every Tuesday and Wednesday afternoon from 1:00 pm until 4:00 pm. You do not need an appointment for the interview!  [facility] [address], [place], [place] [phone]   [facility] Health Department  [phone]   [facility] Health Department  [phone]   [facility] Health Department  [phone]    Behavioral Health Resources in the Community: Intensive Outpatient Programs Organization         Address  Phone  Notes  [facility] [address], [place], [place] [phone]   [facility] [address], [place], [place] [phone]   [facility] [address], [place], [place]  [phone]   [facility] Mental Health [address]  [place], [place] [phone] or [phone]   Substance Abuse Resources Organization         Address  Phone  Notes  Alcohol and  Drug Services  [phone]   [facility]  [phone]   [facility]  [phone]   Floydene Flock  [phone]   Residential & Outpatient Substance Abuse Program  [phone]   Psychological Services Organization         Address  Phone  Notes  [facility] Behavioral Health  [phone]   [facility] Services  [phone]   [facility] Mental Health [address] [place] [phone] or [phone]    Mobile Crisis Teams Organization         Address  Phone  Notes  [facility], Mobile Crisis Care Unit  [phone]   [facility]  [address] [place], [place] [phone]   Doristine Locks [address] [place] [phone]    Self-Help/Support Groups Organization         Address  Phone             Notes  [facility] [place] - variety of support groups  336- I7437963 Call for more information  Narcotics Anonymous (NA), [facility] [address], [employer] [place]  2 meetings at this location   Statistician         Address  Phone  Notes  [facility] [address],    [place]  [phone]   [facility]  [address], [place] [address], [place], [place] [phone]   [facility] [address] [place], [place] [phone] Admissions: 8am-3pm M-F  [facility] [address]    [place], [place] [phone]   [facility] [address] [place], [place], [place] [phone]   [facility] [address],  [place], [place] [phone]   [facility] - Intensive Outpatient [address] Laurell Joseph[address], [place], [place] [phone]   [facility] ([facility] [address] [place]  [place], [place] [phone] or [phone]   [facility] ([facility]) [address] [place], [place] [phone] Accepts Medicaid  [facility]  [place] [address],  [place] [phone] Substance Abuse/Addiction Treatment   [facility] Organization         Address  Phone  Notes  [facility]  [phone]   Angie Fava, PhD [address] Ervin Knack [place], [place]   [phone] or [phone]   [facility] Behavioral   [address] [place], [place] [phone]   [facility] [address] [place], [place] [phone] Insurance/Medicaid/sponsorship through [facility] and Families [address]                                    [place], [place] [phone] Therapy/tele-psych/case  [facility] [address], [place] [phone]    Dr. Lolly Mustache  [phone]   [facility]  [employer] [facility] Dept. 1) [address], Center 2) [address] [place] 3)  [address], [place] [phone]  [phone]   [facility] Child Abuse Hotline (774)608-2805 or [phone])  [phone] (After Hours)

## 2016-01-19 ENCOUNTER — Other Ambulatory Visit: Payer: Self-pay | Admitting: Family Medicine

## 2016-01-21 ENCOUNTER — Other Ambulatory Visit: Payer: Self-pay | Admitting: Gastroenterology

## 2016-01-21 DIAGNOSIS — R1011 Right upper quadrant pain: Secondary | ICD-10-CM

## 2016-01-21 DIAGNOSIS — R110 Nausea: Secondary | ICD-10-CM

## 2016-01-28 ENCOUNTER — Other Ambulatory Visit: Payer: Self-pay | Admitting: Emergency Medicine

## 2016-02-01 ENCOUNTER — Ambulatory Visit (HOSPITAL_COMMUNITY): Payer: BLUE CROSS/BLUE SHIELD | Attending: Gastroenterology

## 2016-02-01 ENCOUNTER — Ambulatory Visit (HOSPITAL_COMMUNITY): Payer: BLUE CROSS/BLUE SHIELD

## 2016-02-15 ENCOUNTER — Telehealth: Payer: Self-pay | Admitting: Family Medicine

## 2016-02-16 NOTE — Telephone Encounter (Signed)
Opened in error

## 2016-12-07 ENCOUNTER — Emergency Department (HOSPITAL_COMMUNITY)
Admission: EM | Admit: 2016-12-07 | Discharge: 2016-12-08 | Disposition: A | Discharge status: Home / Self Care | Payer: BLUE CROSS/BLUE SHIELD | Attending: Emergency Medicine | Admitting: Emergency Medicine

## 2016-12-07 ENCOUNTER — Emergency Department (HOSPITAL_COMMUNITY): Payer: BLUE CROSS/BLUE SHIELD

## 2016-12-07 ENCOUNTER — Encounter (HOSPITAL_COMMUNITY): Payer: Self-pay | Admitting: *Deleted

## 2016-12-07 DIAGNOSIS — K529 Noninfective gastroenteritis and colitis, unspecified: Secondary | ICD-10-CM | POA: Diagnosis not present

## 2016-12-07 DIAGNOSIS — R112 Nausea with vomiting, unspecified: Secondary | ICD-10-CM

## 2016-12-07 DIAGNOSIS — Z79899 Other long term (current) drug therapy: Secondary | ICD-10-CM | POA: Insufficient documentation

## 2016-12-07 DIAGNOSIS — E860 Dehydration: Secondary | ICD-10-CM | POA: Diagnosis not present

## 2016-12-07 DIAGNOSIS — R1011 Right upper quadrant pain: Secondary | ICD-10-CM

## 2016-12-07 LAB — COMPREHENSIVE METABOLIC PANEL
ALK PHOS: 87 U/L (ref 38–126)
ALT: 10 U/L — ABNORMAL LOW (ref 14–54)
ANION GAP: 10 (ref 5–15)
AST: 22 U/L (ref 15–41)
Albumin: 4.7 g/dL (ref 3.5–5.0)
BILIRUBIN TOTAL: 0.6 mg/dL (ref 0.3–1.2)
BUN: 10 mg/dL (ref 6–20)
CALCIUM: 9.8 mg/dL (ref 8.9–10.3)
CO2: 27 mmol/L (ref 22–32)
Chloride: 97 mmol/L — ABNORMAL LOW (ref 101–111)
Creatinine, Ser: 0.74 mg/dL (ref 0.44–1.00)
GFR calc Af Amer: >60 mL/min (ref >60)
GLUCOSE: 81 mg/dL (ref 65–99)
Potassium: 3.6 mmol/L (ref 3.5–5.1)
Sodium: 134 mmol/L — ABNORMAL LOW (ref 135–145)
TOTAL PROTEIN: 8.9 g/dL — AB (ref 6.5–8.1)

## 2016-12-07 LAB — LIPASE, BLOOD: Lipase: 21 U/L (ref 11–51)

## 2016-12-07 LAB — CBC
HEMATOCRIT: 36.9 % (ref 36.0–46.0)
HEMOGLOBIN: 11.9 g/dL — AB (ref 12.0–15.0)
MCH: 26.4 pg (ref 26.0–34.0)
MCHC: 32.2 g/dL (ref 30.0–36.0)
MCV: 81.8 fL (ref 78.0–100.0)
Platelets: 384 10*3/uL (ref 150–400)
RBC: 4.51 MIL/uL (ref 3.87–5.11)
RDW: 13.8 % (ref 11.5–15.5)
WBC: 6.3 10*3/uL (ref 4.0–10.5)

## 2016-12-07 MED ORDER — MORPHINE SULFATE (PF) 4 MG/ML IV SOLN
4.0000 mg | Freq: Once | INTRAVENOUS | Status: AC
  Administered 2016-12-08: 4 mg via INTRAVENOUS
  Filled 2016-12-07: qty 1

## 2016-12-07 MED ORDER — ONDANSETRON HCL 4 MG/2ML IJ SOLN
4.0000 mg | Freq: Once | INTRAMUSCULAR | Status: AC
  Administered 2016-12-08: 4 mg via INTRAVENOUS
  Filled 2016-12-07: qty 2

## 2016-12-07 MED ORDER — SODIUM CHLORIDE 0.9 % IV BOLUS (SEPSIS)
2000.0000 mL | Freq: Once | INTRAVENOUS | Status: AC
  Administered 2016-12-08: 2000 mL via INTRAVENOUS

## 2016-12-07 NOTE — ED Triage Notes (Signed)
Pt has had nausea and vomiting for 3 days, chills and generalized weakness.  No diarrhea.

## 2016-12-07 NOTE — ED Notes (Signed)
I attempted to collect labs and patient stated she was about to pass out  So I was unable to collect labs

## 2016-12-07 NOTE — ED Notes (Signed)
Pt aware she needs urine sample, unable at this time

## 2016-12-07 NOTE — ED Provider Notes (Signed)
[facility]-EMERGENCY DEPT Provider Note   CSN: [id number] Arrival date & time: 12/07/16  2125  By signing my name below, I, Modena Jansky, attest that this documentation has been prepared under the direction and in the presence of non-physician practitioner, [address] PA-C. Electronically Signed: Modena Jansky, Scribe. 12/07/2016. 11:41 PM.  History   Chief Complaint Chief Complaint  Patient presents with  . Emesis   The history is provided by the patient and medical records. No language interpreter was used.  Emesis   This is a new problem. The current episode started more than 2 days ago. The problem occurs 2 to 4 times per day. The problem has not changed since onset.There has been no fever. Associated symptoms include abdominal pain, chills and myalgias (Generalized cramps). Pertinent negatives include no arthralgias, no diarrhea and no fever.    HPI Comments: Tracy Murphy is a 26 y.o. female who presents to the Emergency Department complaining of nausea and vomiting that started about 3 days ago with associated RUQ pain. She states she has chronic RUQ pain due to gallbladder dysfunction, and this usually gets aggravated by eating fatty foods. She states she vomited about 2 -3 times today, all her emesis has been nonbloody and nonbilious, and she can't quantify how many times she vomited over the last several days. She has not been able to eat anything since onset. She reports that anything she eats comes back up. She describes her associated abdominal pain as 7/10 intermittent, dull, RUQ pain radiating to her back, exacerbated by fatty foods, and relieved by tylenol and omeprazole. She further reports associated chills, fatigue, nausea,  generalized cramps especially in her hands, and lightheadedness like a feeling of dehydration. LNMP: 3 weeks ago. Denies being sexually active. Denies recent travel, sick contacts, recent suspicious food intake, NSAID use, hx of abdominal surgeries, or EtOH  use. Denies fevers, CP, SOB, hematemesis, melena, hematochezia, diarrhea, constipation, obstipation, hematuria, dysuria, vaginal bleeding/discharge, arthralgias, numbness, tingling, focal weakness, or any other complaints at this time. Sees a GI specialist at [facility].   PCP: Alwyn Pea, MD     Past Medical History:  Diagnosis Date  . Anxiety   . Headache     Patient Active Problem List   Diagnosis Date Noted  . Migraine 10/11/2013  . Abdominal pain, epigastric 05/02/2013    Past Surgical History:  Procedure Laterality Date  . WISDOM TOOTH EXTRACTION      OB History    No data available       Home Medications    Prior to Admission medications   Medication Sig Start Date End Date Taking? Authorizing Provider  acetaminophen (TYLENOL) 500 MG tablet Take 1,000 mg by mouth every 6 (six) hours as needed (For pain.).   Yes [provider]  omeprazole (PRILOSEC) 40 MG capsule Take 40 mg by mouth 2 (two) times daily as needed (acid reflux).   Yes [provider]  ondansetron (ZOFRAN ODT) 4 MG disintegrating tablet Take 1 tablet (4 mg total) by mouth every 8 (eight) hours as needed for nausea or vomiting. Patient not taking: Reported on 09/11/2015 02/28/15   Shade Flood, MD  traMADol (ULTRAM) 50 MG tablet Take 1 tablet (50 mg total) by mouth every 6 (six) hours as needed. Patient not taking: Reported on 09/11/2015 02/28/15   Shade Flood, MD    Family History Family History  Problem Relation Age of Onset  . Diabetes Father   . Heart disease Father   .  Hypertension Father   . Migraines Father   . Hyperlipidemia Father     Social History Social History  Substance Use Topics  . Smoking status: Never Smoker  . Smokeless tobacco: Never Used  . Alcohol use No     Allergies   Reglan [metoclopramide]   Review of Systems Review of Systems  Constitutional: Positive for chills and fatigue. Negative for fever.  Respiratory: Negative for  shortness of breath.   Cardiovascular: Negative for chest pain.  Gastrointestinal: Positive for abdominal pain, nausea and vomiting. Negative for blood in stool, constipation and diarrhea.  Genitourinary: Negative for dysuria, hematuria, vaginal bleeding and vaginal discharge.  Musculoskeletal: Positive for myalgias (Generalized cramps). Negative for arthralgias.  Skin: Negative for color change.  Allergic/Immunologic: Negative for immunocompromised state.  Neurological: Positive for light-headedness (feels dehydrated). Negative for weakness and numbness.  Psychiatric/Behavioral: Negative for confusion.  All other systems reviewed and are negative for acute change except as noted in the HPI.  Physical Exam Updated Vital Signs BP 120/90 (BP Location: Right Arm)   Pulse 98   Temp 98.5 F (36.9 C) (Oral)   Resp 18   LMP 11/23/2016   SpO2 100%   Physical Exam  Constitutional: She is oriented to person, place, and time. Vital signs are normal. She appears well-developed and well-nourished.  Non-toxic appearance. No distress.  Afebrile, nontoxic, NAD  HENT:  Head: Normocephalic and atraumatic.  Mouth/Throat: Oropharynx is clear and moist. Mucous membranes are dry.  Moderately dry lips.   Eyes: Conjunctivae and EOM are normal. Right eye exhibits no discharge. Left eye exhibits no discharge.  Neck: Normal range of motion. Neck supple.  Cardiovascular: Normal rate, regular rhythm, normal heart sounds and intact distal pulses.  Exam reveals no gallop and no friction rub.   No murmur heard. Pulmonary/Chest: Effort normal and breath sounds normal. No respiratory distress. She has no decreased breath sounds. She has no wheezes. She has no rhonchi. She has no rales.  Abdominal: Soft. Normal appearance and bowel sounds are normal. She exhibits no distension. There is tenderness in the right upper quadrant. There is positive Murphy's sign. There is no rigidity, no rebound, no guarding, no CVA  tenderness and no tenderness at McBurney's point.  Soft, nondistended, +BS throughout, with moderate RUQ TTP, no r/g/r, +murphy's, neg mcburney's, no CVA TTP  Musculoskeletal: Normal range of motion.  Neurological: She is alert and oriented to person, place, and time. She has normal strength. No sensory deficit.  Skin: Skin is warm, dry and intact. No rash noted.  Psychiatric: She has a normal mood and affect.  Nursing note and vitals reviewed.    ED Treatments / Results  DIAGNOSTIC STUDIES: Oxygen Saturation is 100% on RA, normal by my interpretation.    COORDINATION OF CARE: 11:45 PM- Pt advised of plan for treatment and pt agrees.  Labs (all labs ordered are listed, but only abnormal results are displayed) Labs Reviewed  COMPREHENSIVE METABOLIC PANEL - Abnormal; Notable for the following:       Result Value   Sodium 134 (*)    Chloride 97 (*)    Total Protein 8.9 (*)    ALT 10 (*)    All other components within normal limits  CBC - Abnormal; Notable for the following:    Hemoglobin 11.9 (*)    All other components within normal limits  URINALYSIS, ROUTINE W REFLEX MICROSCOPIC - Abnormal; Notable for the following:    APPearance HAZY (*)    Ketones, ur  20 (*)    Leukocytes, UA SMALL (*)    Bacteria, UA MANY (*)    Squamous Epithelial / LPF 6-30 (*)    All other components within normal limits  LIPASE, BLOOD  POC URINE PREG, ED  POC URINE PREG, ED    EKG  EKG Interpretation None       Radiology [place] Abdomen Complete  Result Date: 12/08/2016 CLINICAL DATA:  Acute onset of right upper quadrant abdominal pain, nausea and vomiting. Initial encounter. EXAM: ABDOMEN ULTRASOUND COMPLETE COMPARISON:  CT of the abdomen and pelvis performed 12/09/2014, and abdominal ultrasound performed 12/29/2014 FINDINGS: Gallbladder: No gallstones or wall thickening visualized. No sonographic Murphy sign noted by sonographer. Common bile duct: Diameter: 0.3 cm, within normal limits in  caliber. Liver: No focal lesion identified. Within normal limits in parenchymal echogenicity. IVC: No abnormality visualized. Pancreas: Visualized portion unremarkable. Spleen: Size and appearance within normal limits. Right Kidney: Length: 9.6 cm. Echogenicity within normal limits. No mass or hydronephrosis visualized. Left Kidney: Length: 9.7 cm. Echogenicity within normal limits. No mass or hydronephrosis visualized. Abdominal aorta: No aneurysm visualized. Other findings: None. IMPRESSION: Unremarkable abdominal ultrasound. Electronically Signed   By: Roanna RaiderJeffery  Chang M.D.   On: 12/08/2016 01:31   HIDA scan 12/2014 Study Result  CLINICAL DATA:  Right upper quadrant pain.  Nausea vomiting.  EXAM: NUCLEAR MEDICINE HEPATOBILIARY IMAGING WITH GALLBLADDER EF  TECHNIQUE: Sequential images of the abdomen were obtained out to 60 minutes following intravenous administration of radiopharmaceutical. After slow intravenous infusion of 1.3 micrograms Cholecystokinin, gallbladder ejection fraction was determined.  RADIOPHARMACEUTICALS:  5.4 mCi Technetium-3429m Choletec IV  COMPARISON:  None.  FINDINGS: Liver, biliary system, gallbladder, bowel visualize normally. Gallbladder ejection fraction at 45 minutes is 67%. This is normal. Patient did experience pain with CCK administration. At 45 min, normal ejection fraction is greater than 40%.  IMPRESSION: Normal exam.   Electronically Signed   By: Maisie Fushomas  Register   On: 01/09/2015 11:56    Procedures Procedures (including critical care time)  Medications Ordered in ED Medications  sodium chloride 0.9 % bolus 2,000 mL (2,000 mLs Intravenous New Bag/Given 12/08/16 0125)  ondansetron (ZOFRAN) injection 4 mg (4 mg Intravenous Given 12/08/16 0125)  morphine 4 MG/ML injection 4 mg (4 mg Intravenous Given 12/08/16 0125)     Initial Impression / Assessment and Plan / ED Course  I have reviewed the triage vital signs and the nursing  notes.  Pertinent labs & imaging results that were available during my care of the patient were reviewed by me and considered in my medical decision making (see chart for details).     26 y.o. female here with RUQ pain/n/v x3 days, some chills, and feeling dehydrated. Has had this happen before, was told her gallbladder "only works 67%". Appears she had HIDA scan which was actually normal, however it does mention that her EF was 67%. On exam, moderate RUQ TTP and +murphys, nonperitoneal, no flank tenderness. Lips fairly dry, appears dehydrated. CBC WNL, awaiting CMP and Lipase, as well as U/A and Upreg (will switch to betaHCG instead). Will get U/S to r/o gallbladder etiology. Will give fluids, pain meds, nausea meds, and reassess shortly.   4:22 AM Multiple issues with the computer system have caused significant delays. I was unable to see what the pt's results were to her testing due to Epic failures; even "read only" is down. Pt already feeling better, tolerating PO well; will attempt to urinate for us so we can get U/A  and Upreg and at least be one step closer to finishing today's prolonged evaluation due to Epic failures. Will reassess shortly.  5:32 AM Epic finally back up. CMP WNL and lipase WNL, U/A with 6-30 squamous so fairly contaminated, doubt UTI given lack of UTI symptoms and lack of significant U/A findings; doubt UCx would help given contaminated specimen. Upreg neg. Overall symptoms seem likely related to gallbladder dysfunction, vs gastroenteritis. Pt continues to feel improved, tolerating PO well. Will send home with zofran, advised staying well hydrated, tylenol/motrin for pain, eat low fat diet, f/up with PCP/GI in 1wk for recheck. I explained the diagnosis and have given explicit precautions to return to the ER including for any other new or worsening symptoms. The patient understands and accepts the medical plan as it's been dictated and I have answered their questions. Discharge  instructions concerning home care and prescriptions have been given. The patient is STABLE and is discharged to home in good condition.   I personally performed the services described in this documentation, which was scribed in my presence. The recorded information has been reviewed and is accurate.   Final Clinical Impressions(s) / ED Diagnoses   Final diagnoses:  RUQ abdominal pain  Nausea and vomiting in adult patient  Dehydration  Gastroenteritis    New Prescriptions New Prescriptions   ONDANSETRON (ZOFRAN ODT) 4 MG DISINTEGRATING TABLET    Take 1 tablet (4 mg total) by mouth every 8 (eight) hours as needed for nausea or vomiting.      [address], [place], [place] 12/08/16 0534    Nicanor Alcon, April, MD 12/08/16 [phone]

## 2016-12-08 LAB — URINALYSIS, ROUTINE W REFLEX MICROSCOPIC
Bilirubin Urine: NEGATIVE
Glucose, UA: NEGATIVE mg/dL
HGB URINE DIPSTICK: NEGATIVE
Ketones, ur: 20 mg/dL — AB
Nitrite: NEGATIVE
PROTEIN: NEGATIVE mg/dL
Specific Gravity, Urine: 1.018 (ref 1.005–1.030)
pH: 5 (ref 5.0–8.0)

## 2016-12-08 LAB — POC URINE PREG, ED: PREG TEST UR: NEGATIVE

## 2016-12-08 MED ORDER — ONDANSETRON 4 MG PO TBDP: 4.0000 mg | ORAL_TABLET | Freq: Three times a day (TID) | ORAL | 0 refills | Status: DC | PRN

## 2016-12-08 NOTE — Discharge Instructions (Signed)
Your symptoms are likely related to your gallbladder dysfunction, but could also be related to a viral gastrointestinal illness. Your labs today are reassuring. Alternate between tylenol and motrin as needed for pain. Stay well hydrated. Use zofran as directed as needed for nausea/vomiting. Eat a low fat diet. Follow up with your primary care doctor or gastroenterologist in 1 week for recheck of symptoms and ongoing management of your symptoms. Return to the ER for emergent changes or worsening symptoms.   Abdominal (belly) pain can be caused by many things. Your caregiver performed an examination and possibly ordered blood/urine tests and imaging (CT scan, x-rays, ultrasound). Many cases can be observed and treated at home after initial evaluation in the emergency department. Even though you are being discharged home, abdominal pain can be unpredictable. Therefore, you need a repeated exam if your pain does not resolve, returns, or worsens. Most patients with abdominal pain don't have to be admitted to the hospital or have surgery, but serious problems like appendicitis and gallbladder attacks can start out as nonspecific pain. Many abdominal conditions cannot be diagnosed in one visit, so follow-up evaluations are very important. SEEK IMMEDIATE MEDICAL ATTENTION IF YOU DEVELOP ANY OF THE FOLLOWING SYMPTOMS: The pain does not go away or becomes severe.  A temperature above 101 develops.  Repeated vomiting occurs (multiple episodes).  The pain becomes localized to portions of the abdomen. The right side could possibly be appendicitis. In an adult, the left lower portion of the abdomen could be colitis or diverticulitis.  Blood is being passed in stools or vomit (bright red or black tarry stools).  Return also if you develop chest pain, difficulty breathing, dizziness or fainting, or become confused, poorly responsive, or inconsolable (young children). The constipation stays for more than 4 days.  There is  belly (abdominal) or rectal pain.  You do not seem to be getting better.

## 2016-12-08 NOTE — ED Notes (Signed)
Attempted to start an IV x 1 but unsuccessful.

## 2016-12-10 ENCOUNTER — Encounter (HOSPITAL_COMMUNITY): Payer: Self-pay

## 2016-12-10 ENCOUNTER — Emergency Department (HOSPITAL_COMMUNITY): Payer: BLUE CROSS/BLUE SHIELD

## 2016-12-10 ENCOUNTER — Emergency Department (HOSPITAL_COMMUNITY)
Admission: EM | Admit: 2016-12-10 | Discharge: 2016-12-10 | Disposition: A | Discharge status: Home / Self Care | Payer: BLUE CROSS/BLUE SHIELD | Attending: Emergency Medicine | Admitting: Emergency Medicine

## 2016-12-10 DIAGNOSIS — R1011 Right upper quadrant pain: Secondary | ICD-10-CM | POA: Insufficient documentation

## 2016-12-10 DIAGNOSIS — R197 Diarrhea, unspecified: Secondary | ICD-10-CM | POA: Diagnosis not present

## 2016-12-10 DIAGNOSIS — R1010 Upper abdominal pain, unspecified: Secondary | ICD-10-CM

## 2016-12-10 DIAGNOSIS — E876 Hypokalemia: Secondary | ICD-10-CM | POA: Diagnosis not present

## 2016-12-10 DIAGNOSIS — R112 Nausea with vomiting, unspecified: Secondary | ICD-10-CM

## 2016-12-10 LAB — CBC
HCT: 32.9 % — ABNORMAL LOW (ref 36.0–46.0)
HEMOGLOBIN: 10.5 g/dL — AB (ref 12.0–15.0)
MCH: 26.2 pg (ref 26.0–34.0)
MCHC: 31.9 g/dL (ref 30.0–36.0)
MCV: 82 fL (ref 78.0–100.0)
PLATELETS: 479 10*3/uL — AB (ref 150–400)
RBC: 4.01 MIL/uL (ref 3.87–5.11)
RDW: 14 % (ref 11.5–15.5)
WBC: 6.1 10*3/uL (ref 4.0–10.5)

## 2016-12-10 LAB — POC URINE PREG, ED: PREG TEST UR: NEGATIVE

## 2016-12-10 LAB — URINALYSIS, ROUTINE W REFLEX MICROSCOPIC
Bilirubin Urine: NEGATIVE
Glucose, UA: NEGATIVE mg/dL
Hgb urine dipstick: NEGATIVE
Ketones, ur: NEGATIVE mg/dL
LEUKOCYTES UA: NEGATIVE
Nitrite: NEGATIVE
PROTEIN: NEGATIVE mg/dL
Specific Gravity, Urine: 1.015 (ref 1.005–1.030)
pH: 5 (ref 5.0–8.0)

## 2016-12-10 LAB — COMPREHENSIVE METABOLIC PANEL
ALT: 9 U/L — AB (ref 14–54)
AST: 21 U/L (ref 15–41)
Albumin: 4.4 g/dL (ref 3.5–5.0)
Alkaline Phosphatase: 74 U/L (ref 38–126)
Anion gap: 9 (ref 5–15)
BUN: <5 mg/dL — ABNORMAL LOW (ref 6–20)
CALCIUM: 9.5 mg/dL (ref 8.9–10.3)
CHLORIDE: 105 mmol/L (ref 101–111)
CO2: 23 mmol/L (ref 22–32)
CREATININE: 0.56 mg/dL (ref 0.44–1.00)
Glucose, Bld: 90 mg/dL (ref 65–99)
Potassium: 3.2 mmol/L — ABNORMAL LOW (ref 3.5–5.1)
Sodium: 137 mmol/L (ref 135–145)
Total Bilirubin: 0.4 mg/dL (ref 0.3–1.2)
Total Protein: 8.1 g/dL (ref 6.5–8.1)

## 2016-12-10 LAB — LIPASE, BLOOD: LIPASE: 27 U/L (ref 11–51)

## 2016-12-10 MED ORDER — ONDANSETRON HCL 4 MG/2ML IJ SOLN
4.0000 mg | Freq: Once | INTRAMUSCULAR | Status: AC
  Administered 2016-12-10: 4 mg via INTRAVENOUS
  Filled 2016-12-10: qty 2

## 2016-12-10 MED ORDER — FAMOTIDINE IN NACL 20-0.9 MG/50ML-% IV SOLN
20.0000 mg | Freq: Once | INTRAVENOUS | Status: AC
  Administered 2016-12-10: 20 mg via INTRAVENOUS
  Filled 2016-12-10: qty 50

## 2016-12-10 MED ORDER — GI COCKTAIL ~~LOC~~
30.0000 mL | Freq: Once | ORAL | Status: AC
  Administered 2016-12-10: 30 mL via ORAL
  Filled 2016-12-10: qty 30

## 2016-12-10 MED ORDER — SODIUM CHLORIDE 0.9 % IV BOLUS (SEPSIS)
2000.0000 mL | Freq: Once | INTRAVENOUS | Status: AC
  Administered 2016-12-10: 2000 mL via INTRAVENOUS

## 2016-12-10 MED ORDER — IOPAMIDOL (ISOVUE-300) INJECTION 61%
100.0000 mL | Freq: Once | INTRAVENOUS | Status: AC | PRN
  Administered 2016-12-10: 100 mL via INTRAVENOUS

## 2016-12-10 MED ORDER — MORPHINE SULFATE (PF) 4 MG/ML IV SOLN
4.0000 mg | Freq: Once | INTRAVENOUS | Status: AC
  Administered 2016-12-10: 4 mg via INTRAVENOUS
  Filled 2016-12-10: qty 1

## 2016-12-10 MED ORDER — RANITIDINE HCL 150 MG PO CAPS: 150.0000 mg | ORAL_CAPSULE | Freq: Every day | ORAL | 0 refills | Status: AC

## 2016-12-10 MED ORDER — SUCRALFATE 1 G PO TABS: 1.0000 g | ORAL_TABLET | Freq: Three times a day (TID) | ORAL | 0 refills | Status: AC

## 2016-12-10 MED ORDER — IOPAMIDOL (ISOVUE-300) INJECTION 61%
INTRAVENOUS | Status: AC
  Filled 2016-12-10: qty 100

## 2016-12-10 MED ORDER — POTASSIUM CHLORIDE CRYS ER 20 MEQ PO TBCR
40.0000 meq | EXTENDED_RELEASE_TABLET | Freq: Once | ORAL | Status: AC
  Administered 2016-12-10: 40 meq via ORAL
  Filled 2016-12-10: qty 2

## 2016-12-10 MED ORDER — DICYCLOMINE HCL 20 MG PO TABS: 20.0000 mg | ORAL_TABLET | Freq: Two times a day (BID) | ORAL | 0 refills | Status: AC

## 2016-12-10 NOTE — Discharge Instructions (Signed)
Please read and follow all provided instructions.  Your diagnoses today include:  1. Upper abdominal pain   2. Nausea vomiting and diarrhea   3. Hypokalemia     Tests performed today include: Blood counts and electrolytes Blood tests to check liver and kidney function Blood tests to check pancreas function Urine test to look for infection and pregnancy (in women) Vital signs. See below for your results today.   Medications prescribed:   Take any prescribed medications only as directed.  Home care instructions:  Follow any educational materials contained in this packet.  Follow-up instructions: Please follow-up with your Gastroenterologist for further evaluation of your symptoms.    Return instructions:  SEEK IMMEDIATE MEDICAL ATTENTION IF: The pain does not go away or becomes severe  A temperature above 101F develops  Repeated vomiting occurs (multiple episodes)  The pain becomes localized to portions of the abdomen. The right side could possibly be appendicitis. In an adult, the left lower portion of the abdomen could be colitis or diverticulitis.  Blood is being passed in stools or vomit (bright red or black tarry stools)  You develop chest pain, difficulty breathing, dizziness or fainting, or become confused, poorly responsive, or inconsolable (young children) If you have any other emergent concerns regarding your health  Additional Information: Abdominal (belly) pain can be caused by many things. Your caregiver performed an examination and possibly ordered blood/urine tests and imaging (CT scan, x-rays, ultrasound). Many cases can be observed and treated at home after initial evaluation in the emergency department. Even though you are being discharged home, abdominal pain can be unpredictable. Therefore, you need a repeated exam if your pain does not resolve, returns, or worsens. Most patients with abdominal pain don't have to be admitted to the hospital or have surgery, but  serious problems like appendicitis and gallbladder attacks can start out as nonspecific pain. Many abdominal conditions cannot be diagnosed in one visit, so follow-up evaluations are very important.  Your vital signs today were: BP 105/72 (BP Location: Left Arm)    Pulse 80    Temp 98.6 F (37 C) (Oral)    Resp 18    LMP 11/23/2016 Comment: negative pregnancy test result 12-10-16   SpO2 100%  If your blood pressure (bp) was elevated above 135/85 this visit, please have this repeated by your doctor within one month. --------------

## 2016-12-10 NOTE — ED Provider Notes (Signed)
[facility]-EMERGENCY DEPT Provider Note   CSN: [id number] Arrival date & time: 12/10/16  0223     History   Chief Complaint Chief Complaint  Patient presents with  . Abdominal Pain    HPI Tracy Murphy is a 26 y.o. female with a PMHx of chronic RUQ pain (HIDA scan negative 12/2014), who presents to the ED with complaints of worsening abdominal pain since the last visit. She was actually see in the ED by myself on 12/08/16 for 3 days of n/v/RUQ pain, had labs that were essentially unremarkable, and abd U/S that was negative; appeared dehydrated at that time; she improved after 2L boluses and zofran/morphine, discharged home with zofran rx and advised to f/up with her PCP/GI dr. She states that since then her pain has spread to across the entire upper abdomen and going to bilateral flanks L>R and has intensified. Today she describes her pain as 10/10 constant dull sharp and burning upper abdominal pain 80 eating to her flanks worse with lying down and eating and unrelieved with Tylenol. She continues to have nausea although it improves with Zofran, however every time she attempts to eat food she has been unable to tolerate it and has had 3 nonbloody nonbilious episodes of emesis today. She's been able to keep fluids down as long as she uses the zofran. She reports a feeling of bloating, as well as chills. She also reports that yesterday she had 2 episodes of somewhat watery nonbloody diarrhea however she has not continued to have diarrhea today. As mentioned yesterday, she has some gallbladder issues per her GI doctor, but takes omeprazole for this and avoids fatty foods.   Denies fevers, CP, SOB, hematemesis, melena, hematochezia, constipation, obstipation, hematuria, dysuria, urinary frequency, vaginal bleeding/discharge, myalgias, arthralgias, numbness, tingling, focal weakness, or any other complaints at this time. Her GI dr is Dr. Katharina Caper at [facility]. LNMP: 3 weeks ago. Denies being sexually  active. Denies recent travel, sick contacts, recent suspicious food intake, NSAID use, hx of abdominal surgeries, or EtOH use. PCP: Alwyn Pea, MD    The history is provided by the patient and medical records. No language interpreter was used.  Abdominal Pain   This is a recurrent problem. The current episode started more than 2 days ago. The problem occurs constantly. The problem has been gradually worsening. The pain is associated with an unknown factor. The pain is located in the epigastric region, LUQ and RUQ. The quality of the pain is dull, burning and sharp. The pain is at a severity of 10/10. The pain is severe. Associated symptoms include diarrhea, nausea and vomiting. Pertinent negatives include fever, flatus, hematochezia, melena, constipation, dysuria, frequency, hematuria, arthralgias and myalgias. The symptoms are aggravated by certain positions and eating. Nothing relieves the symptoms. Past workup includes ultrasound.    Past Medical History:  Diagnosis Date  . Anxiety   . Headache     Patient Active Problem List   Diagnosis Date Noted  . Migraine 10/11/2013  . Abdominal pain, epigastric 05/02/2013    Past Surgical History:  Procedure Laterality Date  . WISDOM TOOTH EXTRACTION      OB History    No data available       Home Medications    Prior to Admission medications   Medication Sig Start Date End Date Taking? Authorizing Provider  acetaminophen (TYLENOL) 500 MG tablet Take 1,000 mg by mouth every 6 (six) hours as needed (For pain.).   Yes [provider]  omeprazole (  PRILOSEC) 40 MG capsule Take 40 mg by mouth 2 (two) times daily as needed (acid reflux).   Yes [provider]  ondansetron (ZOFRAN ODT) 4 MG disintegrating tablet Take 1 tablet (4 mg total) by mouth every 8 (eight) hours as needed for nausea or vomiting. 12/08/16  Yes Donyell Carrell, PA-C  ondansetron (ZOFRAN ODT) 4 MG disintegrating tablet Take 1 tablet (4 mg total) by  mouth every 8 (eight) hours as needed for nausea or vomiting. Patient not taking: Reported on 09/11/2015 02/28/15   Shade FloodGreene, Jeffrey R, MD  traMADol (ULTRAM) 50 MG tablet Take 1 tablet (50 mg total) by mouth every 6 (six) hours as needed. Patient not taking: Reported on 09/11/2015 02/28/15   Shade FloodGreene, Jeffrey R, MD    Family History Family History  Problem Relation Age of Onset  . Diabetes Father   . Heart disease Father   . Hypertension Father   . Migraines Father   . Hyperlipidemia Father     Social History Social History  Substance Use Topics  . Smoking status: Never Smoker  . Smokeless tobacco: Never Used  . Alcohol use No     Allergies   Reglan [metoclopramide]   Review of Systems Review of Systems  Constitutional: Positive for chills. Negative for fever.  Respiratory: Negative for shortness of breath.   Cardiovascular: Negative for chest pain.  Gastrointestinal: Positive for abdominal distention (bloating), abdominal pain, diarrhea, nausea and vomiting. Negative for blood in stool, constipation, flatus, hematochezia and melena.  Genitourinary: Positive for flank pain. Negative for dysuria, frequency, hematuria, vaginal bleeding and vaginal discharge.  Musculoskeletal: Negative for arthralgias and myalgias.  Skin: Negative for color change.  Allergic/Immunologic: Negative for immunocompromised state.  Neurological: Negative for weakness and numbness.  Psychiatric/Behavioral: Negative for confusion.   All other systems reviewed and are negative for acute change except as noted in the HPI.    Physical Exam Updated Vital Signs BP 105/72 (BP Location: Left Arm)   Pulse 80   Temp 98.6 F (37 C) (Oral)   Resp 18   LMP 11/23/2016   SpO2 100%    Physical Exam  Constitutional: She is oriented to person, place, and time. Vital signs are normal. She appears well-developed and well-nourished.  Non-toxic appearance. No distress.  Afebrile, nontoxic, NAD although looks  uncomfortable during abdominal exam  HENT:  Head: Normocephalic and atraumatic.  Mouth/Throat: Oropharynx is clear and moist. Mucous membranes are dry.  Moderately dry lips.   Eyes: Conjunctivae and EOM are normal. Right eye exhibits no discharge. Left eye exhibits no discharge.  Neck: Normal range of motion. Neck supple.  Cardiovascular: Normal rate, regular rhythm, normal heart sounds and intact distal pulses.  Exam reveals no gallop and no friction rub.   No murmur heard. Pulmonary/Chest: Effort normal and breath sounds normal. No respiratory distress. She has no decreased breath sounds. She has no wheezes. She has no rhonchi. She has no rales.  Abdominal: Soft. Normal appearance and bowel sounds are normal. She exhibits no distension. There is tenderness in the right upper quadrant, epigastric area and left upper quadrant. There is guarding and CVA tenderness. There is no rigidity, no rebound, no tenderness at McBurney's point and negative Murphy's sign.  Soft, nondistended, +BS throughout, with significant LUQ and epigastric TTP with some slight milder RUQ TTP, some slight voluntary guarding, no rebound/rigidity, neg murphy's, neg mcburney's, and slight L CVA TTP  Musculoskeletal: Normal range of motion.  Neurological: She is alert and oriented to person, place,  and time. She has normal strength. No sensory deficit.  Skin: Skin is warm, dry and intact. No rash noted.  Psychiatric: She has a normal mood and affect.  Nursing note and vitals reviewed.    ED Treatments / Results  Labs (all labs ordered are listed, but only abnormal results are displayed) Labs Reviewed  COMPREHENSIVE METABOLIC PANEL - Abnormal; Notable for the following:       Result Value   Potassium 3.2 (*)    BUN <5 (*)    ALT 9 (*)    All other components within normal limits  CBC - Abnormal; Notable for the following:    Hemoglobin 10.5 (*)    HCT 32.9 (*)    Platelets 479 (*)    All other components within  normal limits  URINALYSIS, ROUTINE W REFLEX MICROSCOPIC - Abnormal; Notable for the following:    APPearance HAZY (*)    All other components within normal limits  LIPASE, BLOOD  POC URINE PREG, ED    EKG  EKG Interpretation None       Radiology No results found.  US Abdomen Complete  Result Date: 12/08/2016 CLINICAL DATA:  Acute onset of right upper quadrant abdominal pain, nausea and vomiting. Initial encounter. EXAM: ABDOMEN ULTRASOUND COMPLETE COMPARISON:  CT of the abdomen and pelvis performed 12/09/2014, and abdominal ultrasound performed 12/29/2014 FINDINGS: Gallbladder: No gallstones or wall thickening visualized. No sonographic Murphy sign noted by sonographer. Common bile duct: Diameter: 0.3 cm, within normal limits in caliber. Liver: No focal lesion identified. Within normal limits in parenchymal echogenicity. IVC: No abnormality visualized. Pancreas: Visualized portion unremarkable. Spleen: Size and appearance within normal limits. Right Kidney: Length: 9.6 cm. Echogenicity within normal limits. No mass or hydronephrosis visualized. Left Kidney: Length: 9.7 cm. Echogenicity within normal limits. No mass or hydronephrosis visualized. Abdominal aorta: No aneurysm visualized. Other findings: None. IMPRESSION: Unremarkable abdominal ultrasound. Electronically Signed   By: Roanna Raider M.D.   On: 12/08/2016 01:31   HIDA scan 12/2014 Study Result  CLINICAL DATA: Right upper quadrant pain. Nausea vomiting.  EXAM: NUCLEAR MEDICINE HEPATOBILIARY IMAGING WITH GALLBLADDER EF  TECHNIQUE: Sequential images of the abdomen were obtained out to 60 minutes following intravenous administration of radiopharmaceutical. After slow intravenous infusion of 1.3 micrograms Cholecystokinin, gallbladder ejection fraction was determined.  RADIOPHARMACEUTICALS: 5.4 mCi Technetium-25m Choletec IV  COMPARISON: None.  FINDINGS: Liver, biliary system, gallbladder, bowel visualize  normally. Gallbladder ejection fraction at 45 minutes is 67%. This is normal. Patient did experience pain with CCK administration. At 45 min, normal ejection fraction is greater than 40%.  IMPRESSION: Normal exam.   Electronically Signed By: Maisie Fus Register On: 01/09/2015 11:56    Procedures Procedures (including critical care time)  Medications Ordered in ED Medications  sodium chloride 0.9 % bolus 2,000 mL (not administered)  ondansetron (ZOFRAN) injection 4 mg (not administered)  morphine 4 MG/ML injection 4 mg (not administered)  famotidine (PEPCID) IVPB 20 mg premix (not administered)  gi cocktail (Maalox,Lidocaine,Donnatal) (not administered)  potassium chloride SA (K-DUR,KLOR-CON) CR tablet 40 mEq (not administered)  iopamidol (ISOVUE-300) 61 % injection (not administered)  iopamidol (ISOVUE-300) 61 % injection 100 mL (100 mLs Intravenous Contrast Given 12/10/16 0702)     Initial Impression / Assessment and Plan / ED Course  I have reviewed the triage vital signs and the nursing notes.  Pertinent labs & imaging results that were available during my care of the patient were reviewed by me and considered in my medical decision making (see  chart for details).     26 y.o. female here for the second visit in 2 days, seen by me last time she was here. Initially had n/v/RUQ pain 3 days prior to last evaluation, and then in ED she had essentially neg work up with labs and abd U/S, sent home with zofran; since then, pain has become more diffuse across upper abdomen and radiating to b/l flanks L>R. Nausea improves with zofran but she still has not been able to eat without vomiting despite the zofran. Has developed bloating and chills, and had 2 episodes of diarrhea yesterday but not today. On exam, significantly tender in the LUQ and epigastric regions, with some voluntary guarding, no rigidity, and mild L flank tenderness. No lower abd tenderness. Labs today show lipase WNL,  CMP with K 3.2 which we'll replete here; CBC with chronic anemia at baseline (less than last visit, but considering we gave her 2L bolus at that visit, she was likely hemoconcentrated at that time); today plt count slightly elevated at 479. U/A not yet done, will get this now. Given increasing and migrating pain, will proceed with CT scan to further evaluate. Will give pain meds, pepcid, GI cocktail, nausea meds, and fluids again, then reassess shortly.   7:16 AM U/A unremarkable. Upreg neg. CT abd/pelv pending. Patient care to be resumed by Audry Pili, PA-C at shift change sign-out. Patient history has been discussed with midlevel resuming care. Please see their notes for further documentation of pending results and dispo/care. Pt stable at sign-out and updated on transfer of care.    Final Clinical Impressions(s) / ED Diagnoses   Final diagnoses:  Upper abdominal pain  Nausea vomiting and diarrhea  Hypokalemia    New Prescriptions New Prescriptions   No medications on [address] [place], [place], [place] 12/10/16 1610    Gilda Crease, MD 12/11/16 [phone]

## 2016-12-10 NOTE — ED Triage Notes (Signed)
Pt reports worsening severe abd pain. Pt denies n/v and diarrhea.

## 2016-12-10 NOTE — ED Provider Notes (Signed)
  Physical Exam  BP 105/72 (BP Location: Left Arm)   Pulse 80   Temp 98.6 F (37 C) (Oral)   Resp 18   LMP 11/23/2016 Comment: negative pregnancy test result 12-10-16  SpO2 100%   Physical Exam  ED Course  Procedures  MDM  [facility], PA-C  Per previous provider MDM: 26 y.o. female here for the second visit in 2 days, seen by me last time she was here. Initially had n/v/RUQ pain 3 days prior to last evaluation, and then in ED she had essentially neg work up with labs and abd U/S, sent home with zofran; since then, pain has become more diffuse across upper abdomen and radiating to b/l flanks L>R. Nausea improves with zofran but she still has not been able to eat without vomiting despite the zofran. Has developed bloating and chills, and had 2 episodes of diarrhea yesterday but not today. On exam, significantly tender in the LUQ and epigastric regions, with some voluntary guarding, no rigidity, and mild L flank tenderness. No lower abd tenderness. Labs today show lipase WNL, CMP with K 3.2 which we'll replete here; CBC with chronic anemia at baseline (less than last visit, but considering we gave her 2L bolus at that visit, she was likely hemoconcentrated at that time); today plt count slightly elevated at 479. U/A not yet done, will get this now. Given increasing and migrating pain, will proceed with CT scan to further evaluate. Will give pain meds, pepcid, GI cocktail, nausea meds, and fluids again, then reassess shortly.   7:28 AM CT Abdomen/Pelvis unremarkable. Likely gastritis related. Given Rx Carafate in addition to patient taking Protonix. At time of discharge, Patient is in no acute distress. Vital Signs are stable. Patient is able to ambulate. Patient able to tolerate PO.      Audry PiliMohr, Jesusa Stenerson, PA-C 12/10/16 [phone]    Gilda CreasePollina, Christopher J, MD 12/11/16 [phone]

## 2017-02-14 ENCOUNTER — Other Ambulatory Visit: Payer: Self-pay | Admitting: Gastroenterology

## 2017-02-14 DIAGNOSIS — R110 Nausea: Secondary | ICD-10-CM

## 2017-02-14 DIAGNOSIS — R1011 Right upper quadrant pain: Secondary | ICD-10-CM

## 2017-02-14 NOTE — Progress Notes (Signed)
Tracy Zellars MD 

## 2017-09-15 IMAGING — CT CT ABD-PELV W/ CM
2 of 4 series · 16 of 46 positions shown, 18 images · IV contrast (ISOVUE)
Comparison: 12/09/2014.

CLINICAL DATA: Worsening severe abdominal pain.

EXAM:
CT ABDOMEN AND PELVIS WITH CONTRAST
TECHNIQUE: Multidetector CT imaging of the abdomen and pelvis was performed
using the standard protocol following bolus administration of
intravenous contrast.
CONTRAST:  100mL [id number]-[id number] IOPAMIDOL ([id number]-XKK) INJECTION 61%

[Series 2: abd/pel with · axial · 0.74mm/px · z∈[-390,-20]mm · 13 of 84 slices shown, 15 images]
[im 5/84  soft-tissue]
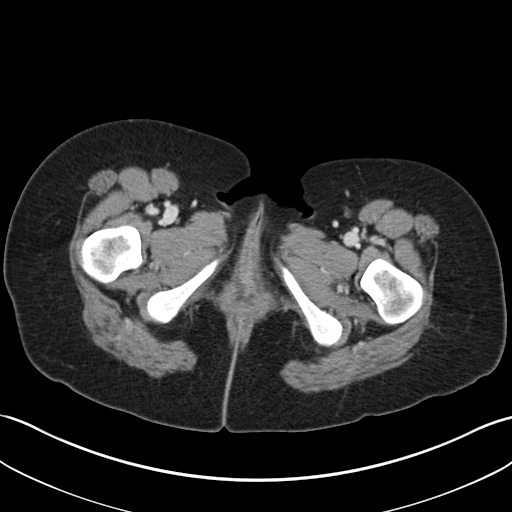
[im 5/84  bone]
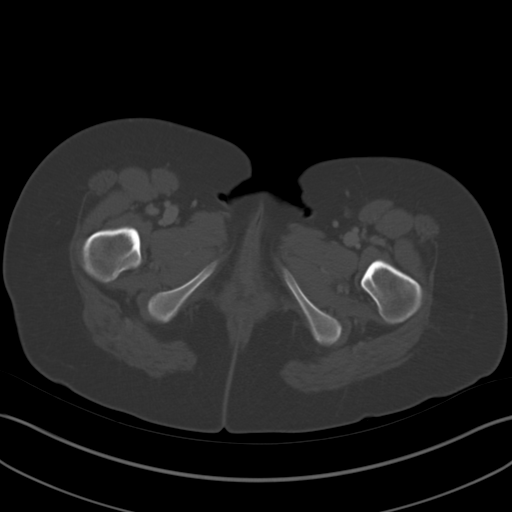
[im 10/84  soft-tissue]
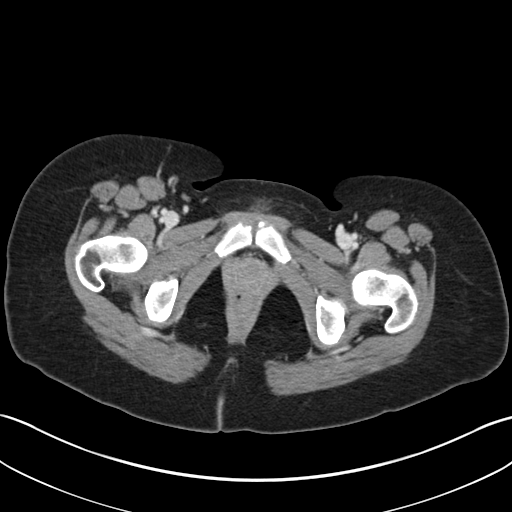
[im 20/84  soft-tissue]
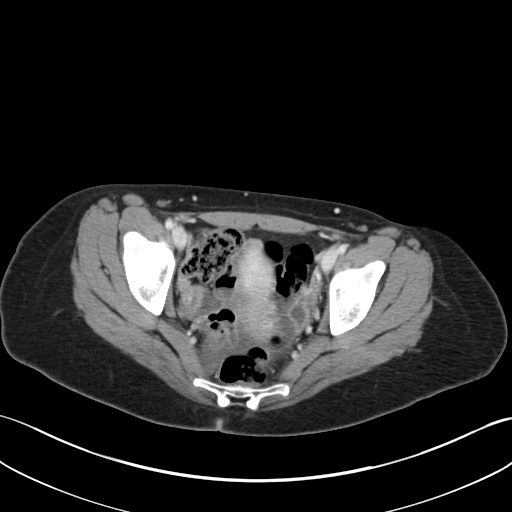
[im 25/84  soft-tissue]
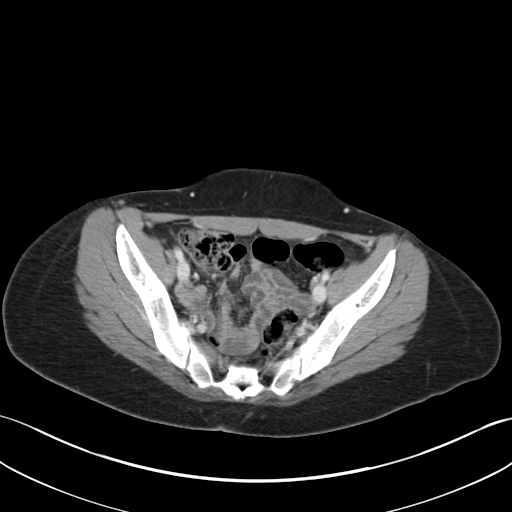
[im 30/84  soft-tissue]
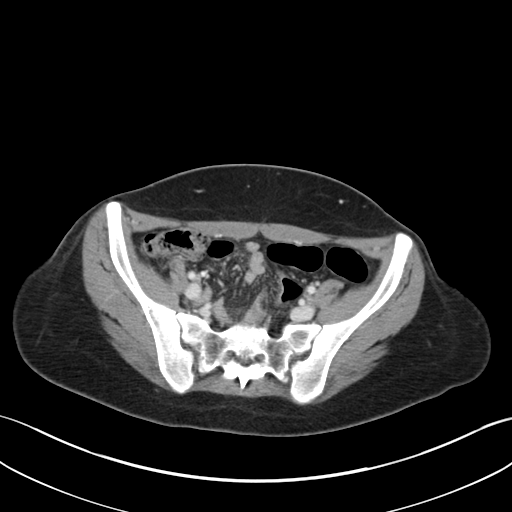
[im 35/84  soft-tissue]
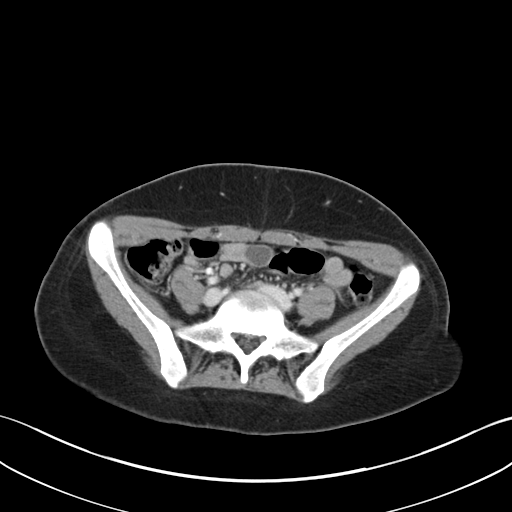
[im 44/84  soft-tissue]
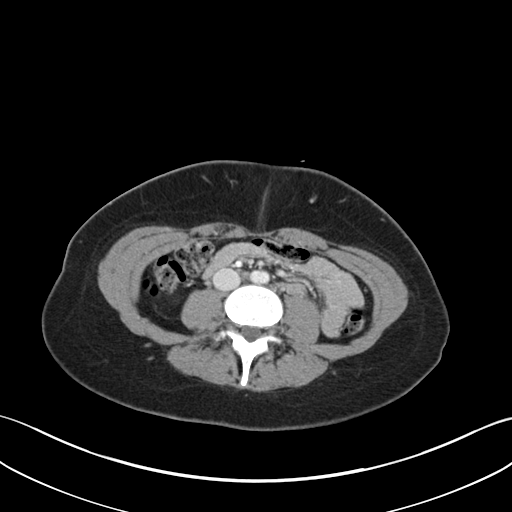
[im 49/84  soft-tissue]
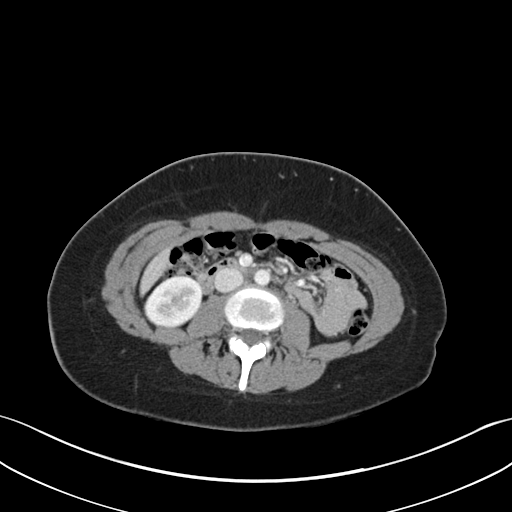
[im 54/84  soft-tissue]
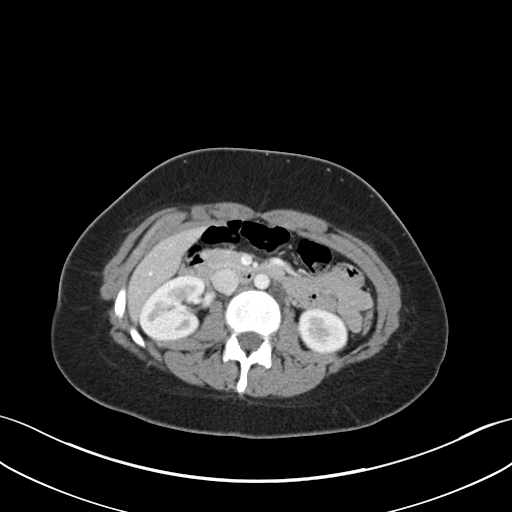
[im 54/84  bone]
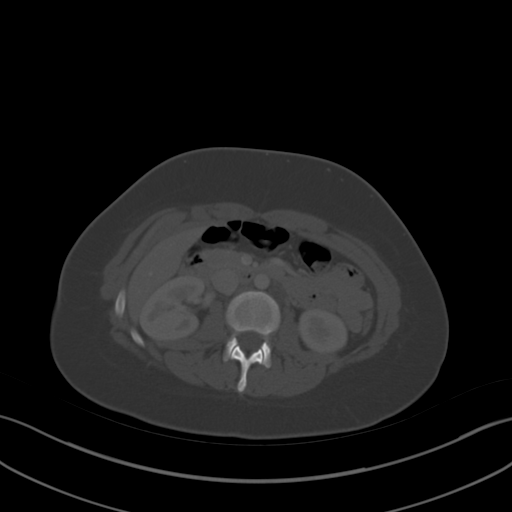
[im 59/84  soft-tissue]
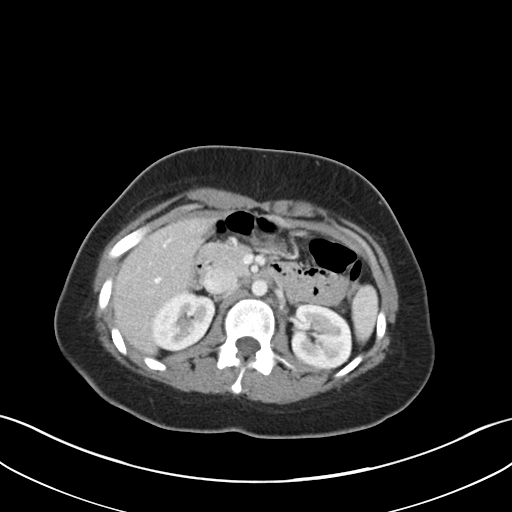
[im 64/84  soft-tissue]
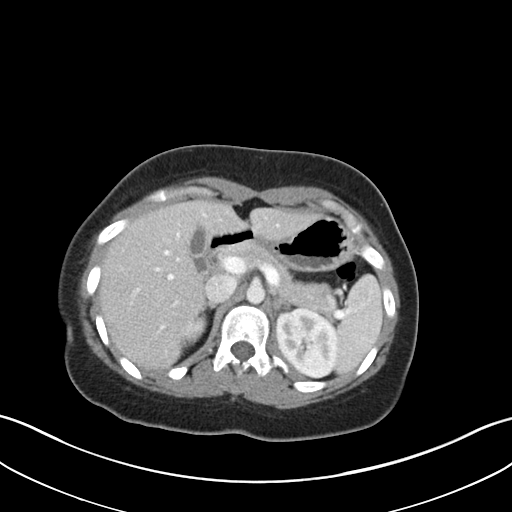
[im 74/84  soft-tissue]
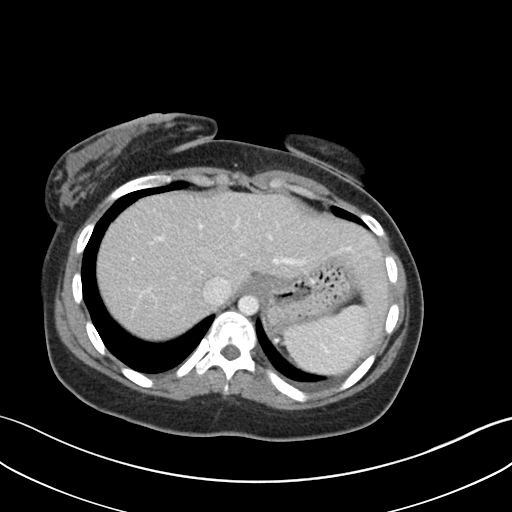
[im 79/84  soft-tissue]
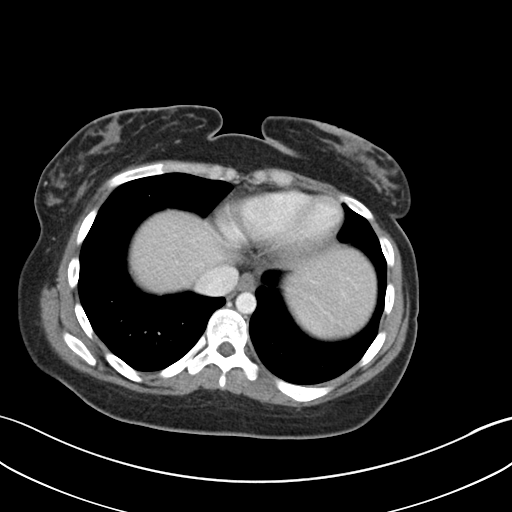

[Series 5: coronal a/|p · coronal · 0.62mm/px · 3 of 104 slices shown]
[im 35/104  soft-tissue]
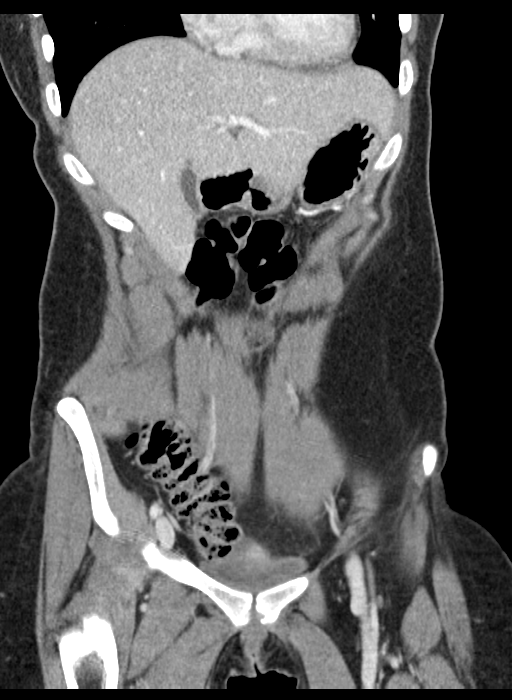
[im 46/104  soft-tissue]
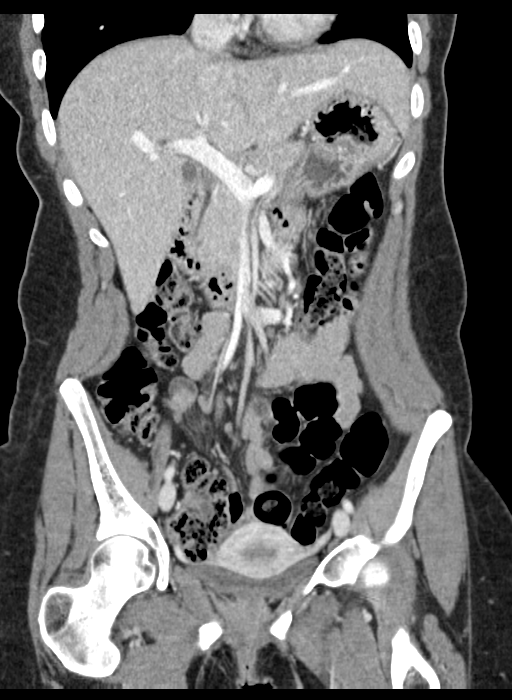
[im 58/104  soft-tissue]
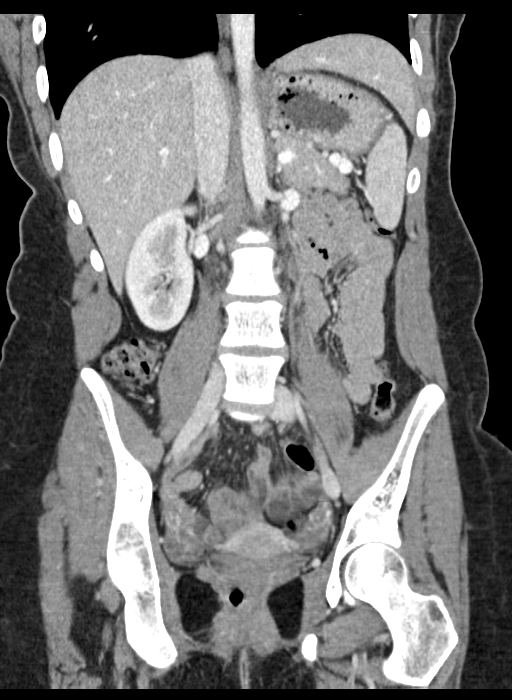

[16 of 46 positions shown; findings below may reference images not displayed]

FINDINGS: Lower chest: Trace bilateral pleural effusions, stable. Heart size
normal. No pericardial effusion.

Hepatobiliary: Liver and gallbladder are unremarkable. No biliary
ductal dilatation.

Pancreas: Negative.

Spleen: Negative.

Adrenals/Urinary Tract: Adrenal glands and kidneys are unremarkable.
Ureters are decompressed. Bladder is grossly unremarkable.

Stomach/Bowel: Stomach, small bowel and colon are unremarkable.
Appendix is not readily visualized but there are no inflammatory
changes in the right lower quadrant to suggest appendicitis.

Vascular/Lymphatic: Vascular structures are unremarkable. No
pathologically enlarged lymph nodes.

Reproductive: Uterus is visualized.  No adnexal mass.

Other: Small pelvic free fluid.

Musculoskeletal: No worrisome lytic or sclerotic lesions.
IMPRESSION: 1. No findings to explain the patient's abdominal pain.
2. Trace bilateral pleural effusions, stable.
3. Small pelvic free fluid, likely physiologic.

## 2023-01-12 DIAGNOSIS — R140 Abdominal distension (gaseous): Secondary | ICD-10-CM | POA: Diagnosis not present

## 2023-01-12 DIAGNOSIS — Z6828 Body mass index (BMI) 28.0-28.9, adult: Secondary | ICD-10-CM | POA: Diagnosis not present

## 2023-01-26 DIAGNOSIS — J029 Acute pharyngitis, unspecified: Secondary | ICD-10-CM | POA: Diagnosis not present

## 2023-01-26 DIAGNOSIS — R509 Fever, unspecified: Secondary | ICD-10-CM | POA: Diagnosis not present

## 2023-01-26 DIAGNOSIS — J019 Acute sinusitis, unspecified: Secondary | ICD-10-CM | POA: Diagnosis not present

## 2023-01-26 DIAGNOSIS — Z03818 Encounter for observation for suspected exposure to other biological agents ruled out: Secondary | ICD-10-CM | POA: Diagnosis not present

## 2023-01-27 ENCOUNTER — Emergency Department (HOSPITAL_BASED_OUTPATIENT_CLINIC_OR_DEPARTMENT_OTHER)
Admission: EM | Admit: 2023-01-27 | Discharge: 2023-01-28 | Disposition: A | Discharge status: Home / Self Care | Payer: BLUE CROSS/BLUE SHIELD | Attending: Emergency Medicine | Admitting: Emergency Medicine

## 2023-01-27 ENCOUNTER — Encounter (HOSPITAL_BASED_OUTPATIENT_CLINIC_OR_DEPARTMENT_OTHER): Payer: Self-pay

## 2023-01-27 ENCOUNTER — Other Ambulatory Visit: Payer: Self-pay

## 2023-01-27 DIAGNOSIS — K529 Noninfective gastroenteritis and colitis, unspecified: Secondary | ICD-10-CM | POA: Diagnosis not present

## 2023-01-27 DIAGNOSIS — R109 Unspecified abdominal pain: Secondary | ICD-10-CM | POA: Diagnosis not present

## 2023-01-27 DIAGNOSIS — R112 Nausea with vomiting, unspecified: Secondary | ICD-10-CM | POA: Diagnosis not present

## 2023-01-27 DIAGNOSIS — R1030 Lower abdominal pain, unspecified: Secondary | ICD-10-CM | POA: Diagnosis not present

## 2023-01-27 LAB — CBC
HCT: 38.1 % (ref 36.0–46.0)
Hemoglobin: 12.4 g/dL (ref 12.0–15.0)
MCH: 29.3 pg (ref 26.0–34.0)
MCHC: 32.5 g/dL (ref 30.0–36.0)
MCV: 90.1 fL (ref 80.0–100.0)
Platelets: 292 10*3/uL (ref 150–400)
RBC: 4.23 MIL/uL (ref 3.87–5.11)
RDW: 13.1 % (ref 11.5–15.5)
WBC: 5.1 10*3/uL (ref 4.0–10.5)
nRBC: 0 % (ref 0.0–0.2)

## 2023-01-27 LAB — COMPREHENSIVE METABOLIC PANEL
ALT: 7 U/L (ref 0–44)
AST: 16 U/L (ref 15–41)
Albumin: 4.1 g/dL (ref 3.5–5.0)
Alkaline Phosphatase: 60 U/L (ref 38–126)
Anion gap: 12 (ref 5–15)
BUN: <5 mg/dL — ABNORMAL LOW (ref 6–20)
CO2: 25 mmol/L (ref 22–32)
Calcium: 9.4 mg/dL (ref 8.9–10.3)
Chloride: 102 mmol/L (ref 98–111)
Creatinine, Ser: 0.49 mg/dL (ref 0.44–1.00)
GFR, Estimated: >60 mL/min (ref >60)
Glucose, Bld: 89 mg/dL (ref 70–99)
Potassium: 3.6 mmol/L (ref 3.5–5.1)
Sodium: 139 mmol/L (ref 135–145)
Total Bilirubin: 0.2 mg/dL — ABNORMAL LOW (ref 0.3–1.2)
Total Protein: 7.5 g/dL (ref 6.5–8.1)

## 2023-01-27 LAB — LIPASE, BLOOD: Lipase: 16 U/L (ref 11–51)

## 2023-01-27 MED ORDER — ONDANSETRON HCL 4 MG/2ML IJ SOLN
4.0000 mg | Freq: Once | INTRAMUSCULAR | Status: AC
  Administered 2023-01-27: 4 mg via INTRAVENOUS
  Filled 2023-01-27: qty 2

## 2023-01-27 MED ORDER — SODIUM CHLORIDE 0.9 % IV BOLUS
1000.0000 mL | Freq: Once | INTRAVENOUS | Status: AC
  Administered 2023-01-27: 1000 mL via INTRAVENOUS

## 2023-01-27 MED ORDER — FENTANYL CITRATE PF 50 MCG/ML IJ SOSY
50.0000 ug | PREFILLED_SYRINGE | Freq: Once | INTRAMUSCULAR | Status: AC
  Administered 2023-01-27: 50 ug via INTRAVENOUS
  Filled 2023-01-27: qty 1

## 2023-01-27 NOTE — ED Provider Notes (Signed)
[place] EMERGENCY DEPARTMENT AT [facility] Provider Note   CSN: [id number] Arrival date & time: 01/27/23  2134     History {Add pertinent medical, surgical, social history, OB history to HPI:1} Chief Complaint  Patient presents with   Abdominal Pain    Tracy Murphy is a 32 y.o. female.  Patient reports no past medical history here with abdominal pain, nausea, vomiting, diarrhea for the past 3 days.  Has crampy lower abdominal pain that has been coming and going but constant for the past day.  Pain is to her lower abdomen.  Associated with frequent diarrhea and nausea and vomiting.  States she is vomited 2 or 3 times today and has had too many episodes of diarrhea to count.  Neither 1 has been bloody.  No known fever.  No travel or sick contacts.  No recent antibiotic use.  Still has appendix and gallbladder.  Still has uterus and ovaries.  Has not traveled anywhere.  Sent from urgent care with concern for surgical abdomen.  Most of her pain is periumbilical.  The history is provided by the patient.  Abdominal Pain Associated symptoms: diarrhea, nausea and vomiting   Associated symptoms: no cough, no dysuria, no fever, no hematuria and no shortness of breath        Home Medications Prior to Admission medications   Medication Sig Start Date End Date Taking? Authorizing Provider  acetaminophen (TYLENOL) 500 MG tablet Take 1,000 mg by mouth every 6 (six) hours as needed (For pain.).    [provider]  dicyclomine (BENTYL) 20 MG tablet Take 1 tablet (20 mg total) by mouth 2 (two) times daily. 12/10/16   Audry Pili, PA-C  omeprazole (PRILOSEC) 40 MG capsule Take 40 mg by mouth 2 (two) times daily as needed (acid reflux).    [provider]  ondansetron (ZOFRAN ODT) 4 MG disintegrating tablet Take 1 tablet (4 mg total) by mouth every 8 (eight) hours as needed for nausea or vomiting. Patient not taking: Reported on 09/11/2015 02/28/15   Shade Flood, MD   ondansetron (ZOFRAN ODT) 4 MG disintegrating tablet Take 1 tablet (4 mg total) by mouth every 8 (eight) hours as needed for nausea or vomiting. 12/08/16   Street, Emma, PA-C  ranitidine (ZANTAC) 150 MG capsule Take 1 capsule (150 mg total) by mouth daily. 12/10/16   Audry Pili, PA-C  sucralfate (CARAFATE) 1 g tablet Take 1 tablet (1 g total) by mouth 4 (four) times daily -  with meals and at bedtime. 12/10/16 12/17/16  Audry Pili, PA-C  traMADol (ULTRAM) 50 MG tablet Take 1 tablet (50 mg total) by mouth every 6 (six) hours as needed. Patient not taking: Reported on 09/11/2015 02/28/15   Shade Flood, MD      Allergies    Reglan [metoclopramide]    Review of Systems   Review of Systems  Constitutional:  Positive for activity change and appetite change. Negative for fever.  HENT:  Negative for congestion and rhinorrhea.   Respiratory:  Negative for cough, chest tightness and shortness of breath.   Gastrointestinal:  Positive for abdominal pain, diarrhea, nausea and vomiting.  Genitourinary:  Negative for dysuria and hematuria.  Musculoskeletal:  Negative for arthralgias and myalgias.  Skin:  Negative for rash.  Neurological:  Negative for dizziness, weakness and headaches.   all other systems are negative except as noted in the HPI and PMH.    Physical Exam Updated Vital Signs BP (!) 129/94 (BP Location:  Right Arm)   Pulse 92   Temp 98 F (36.7 C) (Oral)   Resp 18   Ht 5\' 5"  (1.651 m)   Wt 69.9 kg   LMP 12/27/2022 (Approximate)   SpO2 100%   BMI 25.63 kg/m  Physical Exam Vitals and nursing note reviewed.  Constitutional:      General: She is not in acute distress.    Appearance: She is well-developed.  HENT:     Head: Normocephalic and atraumatic.     Mouth/Throat:     Pharynx: No oropharyngeal exudate.  Eyes:     Conjunctiva/sclera: Conjunctivae normal.     Pupils: Pupils are equal, round, and reactive to light.  Neck:     Comments: No  meningismus. Cardiovascular:     Rate and Rhythm: Normal rate and regular rhythm.     Heart sounds: Normal heart sounds. No murmur heard. Pulmonary:     Effort: Pulmonary effort is normal. No respiratory distress.     Breath sounds: Normal breath sounds.  Abdominal:     Palpations: Abdomen is soft.     Tenderness: There is abdominal tenderness. There is no guarding or rebound.     Comments: Periumbilical abdominal pain with voluntary guarding.  No rebound.  Musculoskeletal:        General: Tenderness present. Normal range of motion.     Cervical back: Normal range of motion and neck supple.     Comments: Paraspinal lumbar tenderness bilaterally  Skin:    General: Skin is warm.  Neurological:     Mental Status: She is alert and oriented to person, place, and time.     Cranial Nerves: No cranial nerve deficit.     Motor: No abnormal muscle tone.     Coordination: Coordination normal.     Comments:  5/5 strength throughout. CN 2-12 intact.Equal grip strength.   Psychiatric:        Behavior: Behavior normal.     ED Results / Procedures / Treatments   Labs (all labs ordered are listed, but only abnormal results are displayed) Labs Reviewed  COMPREHENSIVE METABOLIC PANEL - Abnormal; Notable for the following components:      Result Value   BUN <5 (*)    Total Bilirubin 0.2 (*)    All other components within normal limits  LIPASE, BLOOD  CBC  URINALYSIS, ROUTINE W REFLEX MICROSCOPIC  PREGNANCY, URINE    EKG None  Radiology No results found.  Procedures Procedures  {Document cardiac monitor, telemetry assessment procedure when appropriate:1}  Medications Ordered in ED Medications  sodium chloride 0.9 % bolus 1,000 mL (has no administration in time range)  ondansetron (ZOFRAN) injection 4 mg (has no administration in time range)  fentaNYL (SUBLIMAZE) injection 50 mcg (has no administration in time range)    ED Course/ Medical Decision Making/ A&P   {   Click here  for ABCD2, HEART and other calculatorsREFRESH Note before signing :1}                          Medical Decision Making Amount and/or Complexity of Data Reviewed Labs: ordered. Decision-making details documented in ED Course. Radiology: ordered and independent interpretation performed. Decision-making details documented in ED Course. ECG/medicine tests: ordered and independent interpretation performed. Decision-making details documented in ED Course.  Risk Prescription drug management.   Lower abdominal pain with nausea, vomiting and diarrhea.  No fever.  Abdomen soft but tender periumbilically.  No peritoneal signs.  Will hydrate, treat symptoms, check labs.  {Document critical care time when appropriate:1} {Document review of labs and clinical decision tools ie heart score, Chads2Vasc2 etc:1}  {Document your independent review of radiology images, and any outside records:1} {Document your discussion with family members, caretakers, and with consultants:1} {Document social determinants of health affecting pt's care:1} {Document your decision making why or why not admission, treatments were needed:1} Final Clinical Impression(s) / ED Diagnoses Final diagnoses:  None    Rx / DC Orders ED Discharge Orders     None

## 2023-01-27 NOTE — ED Triage Notes (Signed)
POV from home, A&o x 4, GCS 15, amb to room   C/o bilateral lower abd pain that began 3 days ago. Associated NVD

## 2023-01-28 ENCOUNTER — Emergency Department (HOSPITAL_BASED_OUTPATIENT_CLINIC_OR_DEPARTMENT_OTHER): Payer: BLUE CROSS/BLUE SHIELD

## 2023-01-28 DIAGNOSIS — N3289 Other specified disorders of bladder: Secondary | ICD-10-CM | POA: Diagnosis not present

## 2023-01-28 DIAGNOSIS — K6389 Other specified diseases of intestine: Secondary | ICD-10-CM | POA: Diagnosis not present

## 2023-01-28 LAB — URINALYSIS, ROUTINE W REFLEX MICROSCOPIC
Bilirubin Urine: NEGATIVE
Glucose, UA: NEGATIVE mg/dL
Hgb urine dipstick: NEGATIVE
Ketones, ur: 15 mg/dL — AB
Leukocytes,Ua: NEGATIVE
Nitrite: NEGATIVE
Specific Gravity, Urine: 1.029 (ref 1.005–1.030)
pH: 5.5 (ref 5.0–8.0)

## 2023-01-28 LAB — PREGNANCY, URINE: Preg Test, Ur: NEGATIVE

## 2023-01-28 MED ORDER — PIPERACILLIN-TAZOBACTAM 3.375 G IVPB 30 MIN
3.3750 g | Freq: Once | INTRAVENOUS | Status: AC
  Administered 2023-01-28: 3.375 g via INTRAVENOUS
  Filled 2023-01-28: qty 50

## 2023-01-28 MED ORDER — IOHEXOL 300 MG/ML  SOLN
100.0000 mL | Freq: Once | INTRAMUSCULAR | Status: AC | PRN
  Administered 2023-01-28: 100 mL via INTRAVENOUS

## 2023-01-28 MED ORDER — FENTANYL CITRATE PF 50 MCG/ML IJ SOSY
50.0000 ug | PREFILLED_SYRINGE | Freq: Once | INTRAMUSCULAR | Status: AC
  Administered 2023-01-28: 50 ug via INTRAVENOUS
  Filled 2023-01-28: qty 1

## 2023-01-28 MED ORDER — HYDROCODONE-ACETAMINOPHEN 5-325 MG PO TABS: 1.0000 | ORAL_TABLET | ORAL | 0 refills | Status: AC | PRN

## 2023-01-28 MED ORDER — ONDANSETRON 4 MG PO TBDP: 4.0000 mg | ORAL_TABLET | Freq: Three times a day (TID) | ORAL | 0 refills | Status: AC | PRN

## 2023-01-28 MED ORDER — AMOXICILLIN-POT CLAVULANATE 875-125 MG PO TABS: 1.0000 | ORAL_TABLET | Freq: Two times a day (BID) | ORAL | 0 refills | Status: AC

## 2023-01-28 NOTE — Discharge Instructions (Addendum)
Take the antibiotics as prescribed.  Use pain medication and nausea medication as needed.  Start with a clear liquid diet and advance slowly as tolerated.  Return to the ED with worsening abdominal pain, fever, not able to eat or drink, worsening vomiting or diarrhea or any other concerns.

## 2023-03-02 DIAGNOSIS — R1033 Periumbilical pain: Secondary | ICD-10-CM | POA: Diagnosis not present

## 2023-03-02 DIAGNOSIS — R194 Change in bowel habit: Secondary | ICD-10-CM | POA: Diagnosis not present

## 2023-03-02 DIAGNOSIS — R933 Abnormal findings on diagnostic imaging of other parts of digestive tract: Secondary | ICD-10-CM | POA: Diagnosis not present

## 2023-03-02 DIAGNOSIS — K219 Gastro-esophageal reflux disease without esophagitis: Secondary | ICD-10-CM | POA: Diagnosis not present

## 2023-03-30 DIAGNOSIS — Z23 Encounter for immunization: Secondary | ICD-10-CM | POA: Diagnosis not present

## 2023-03-30 DIAGNOSIS — M5440 Lumbago with sciatica, unspecified side: Secondary | ICD-10-CM | POA: Diagnosis not present

## 2023-03-30 DIAGNOSIS — Z0001 Encounter for general adult medical examination with abnormal findings: Secondary | ICD-10-CM | POA: Diagnosis not present

## 2023-03-30 DIAGNOSIS — K219 Gastro-esophageal reflux disease without esophagitis: Secondary | ICD-10-CM | POA: Diagnosis not present

## 2023-03-30 DIAGNOSIS — R5383 Other fatigue: Secondary | ICD-10-CM | POA: Diagnosis not present

## 2023-03-30 DIAGNOSIS — Z13220 Encounter for screening for lipoid disorders: Secondary | ICD-10-CM | POA: Diagnosis not present

## 2023-03-30 DIAGNOSIS — G43009 Migraine without aura, not intractable, without status migrainosus: Secondary | ICD-10-CM | POA: Diagnosis not present

## 2023-04-11 DIAGNOSIS — K219 Gastro-esophageal reflux disease without esophagitis: Secondary | ICD-10-CM | POA: Diagnosis not present

## 2023-04-11 DIAGNOSIS — K529 Noninfective gastroenteritis and colitis, unspecified: Secondary | ICD-10-CM | POA: Diagnosis not present

## 2023-04-11 DIAGNOSIS — R1032 Left lower quadrant pain: Secondary | ICD-10-CM | POA: Diagnosis not present

## 2023-04-11 DIAGNOSIS — R933 Abnormal findings on diagnostic imaging of other parts of digestive tract: Secondary | ICD-10-CM | POA: Diagnosis not present

## 2023-05-11 DIAGNOSIS — M5431 Sciatica, right side: Secondary | ICD-10-CM | POA: Diagnosis not present

## 2023-05-12 DIAGNOSIS — R933 Abnormal findings on diagnostic imaging of other parts of digestive tract: Secondary | ICD-10-CM | POA: Diagnosis not present

## 2023-05-12 DIAGNOSIS — Z1211 Encounter for screening for malignant neoplasm of colon: Secondary | ICD-10-CM | POA: Diagnosis not present

## 2023-05-12 DIAGNOSIS — R194 Change in bowel habit: Secondary | ICD-10-CM | POA: Diagnosis not present

## 2023-05-12 DIAGNOSIS — R1032 Left lower quadrant pain: Secondary | ICD-10-CM | POA: Diagnosis not present

## 2023-10-04 DIAGNOSIS — G43009 Migraine without aura, not intractable, without status migrainosus: Secondary | ICD-10-CM | POA: Diagnosis not present

## 2023-10-04 DIAGNOSIS — E7800 Pure hypercholesterolemia, unspecified: Secondary | ICD-10-CM | POA: Diagnosis not present

## 2023-10-04 DIAGNOSIS — K219 Gastro-esophageal reflux disease without esophagitis: Secondary | ICD-10-CM | POA: Diagnosis not present

## 2023-10-04 DIAGNOSIS — M5440 Lumbago with sciatica, unspecified side: Secondary | ICD-10-CM | POA: Diagnosis not present

## 2023-11-16 DIAGNOSIS — R10813 Right lower quadrant abdominal tenderness: Secondary | ICD-10-CM | POA: Diagnosis not present

## 2023-11-16 DIAGNOSIS — R399 Unspecified symptoms and signs involving the genitourinary system: Secondary | ICD-10-CM | POA: Diagnosis not present

## 2023-11-16 DIAGNOSIS — R1030 Lower abdominal pain, unspecified: Secondary | ICD-10-CM | POA: Diagnosis not present

## 2023-12-08 DIAGNOSIS — E7800 Pure hypercholesterolemia, unspecified: Secondary | ICD-10-CM | POA: Diagnosis not present

## 2024-01-18 ENCOUNTER — Other Ambulatory Visit: Payer: Self-pay | Admitting: Gastroenterology

## 2024-01-18 DIAGNOSIS — R1011 Right upper quadrant pain: Secondary | ICD-10-CM

## 2024-01-18 DIAGNOSIS — R112 Nausea with vomiting, unspecified: Secondary | ICD-10-CM | POA: Diagnosis not present

## 2024-01-18 DIAGNOSIS — R194 Change in bowel habit: Secondary | ICD-10-CM | POA: Diagnosis not present

## 2024-01-18 DIAGNOSIS — K219 Gastro-esophageal reflux disease without esophagitis: Secondary | ICD-10-CM | POA: Diagnosis not present

## 2024-01-20 ENCOUNTER — Other Ambulatory Visit: Payer: Self-pay

## 2024-01-20 ENCOUNTER — Emergency Department (HOSPITAL_BASED_OUTPATIENT_CLINIC_OR_DEPARTMENT_OTHER)
Admission: EM | Admit: 2024-01-20 | Discharge: 2024-01-20 | Disposition: A | Discharge status: Home / Self Care | Attending: Emergency Medicine | Admitting: Emergency Medicine

## 2024-01-20 ENCOUNTER — Encounter (HOSPITAL_BASED_OUTPATIENT_CLINIC_OR_DEPARTMENT_OTHER): Payer: Self-pay

## 2024-01-20 ENCOUNTER — Emergency Department (HOSPITAL_BASED_OUTPATIENT_CLINIC_OR_DEPARTMENT_OTHER)

## 2024-01-20 ENCOUNTER — Other Ambulatory Visit (HOSPITAL_BASED_OUTPATIENT_CLINIC_OR_DEPARTMENT_OTHER): Payer: Self-pay

## 2024-01-20 DIAGNOSIS — R1011 Right upper quadrant pain: Secondary | ICD-10-CM | POA: Diagnosis not present

## 2024-01-20 DIAGNOSIS — R112 Nausea with vomiting, unspecified: Secondary | ICD-10-CM | POA: Diagnosis not present

## 2024-01-20 DIAGNOSIS — R109 Unspecified abdominal pain: Secondary | ICD-10-CM | POA: Diagnosis not present

## 2024-01-20 LAB — COMPREHENSIVE METABOLIC PANEL WITH GFR
ALT: <5 U/L (ref 0–44)
AST: 17 U/L (ref 15–41)
Albumin: 4.3 g/dL (ref 3.5–5.0)
Alkaline Phosphatase: 78 U/L (ref 38–126)
Anion gap: 13 (ref 5–15)
BUN: <5 mg/dL — ABNORMAL LOW (ref 6–20)
CO2: 24 mmol/L (ref 22–32)
Calcium: 9.8 mg/dL (ref 8.9–10.3)
Chloride: 102 mmol/L (ref 98–111)
Creatinine, Ser: 0.57 mg/dL (ref 0.44–1.00)
GFR, Estimated: >60 mL/min (ref >60)
Glucose, Bld: 82 mg/dL (ref 70–99)
Potassium: 3.6 mmol/L (ref 3.5–5.1)
Sodium: 139 mmol/L (ref 135–145)
Total Bilirubin: 0.3 mg/dL (ref 0.0–1.2)
Total Protein: 7.4 g/dL (ref 6.5–8.1)

## 2024-01-20 LAB — URINALYSIS, ROUTINE W REFLEX MICROSCOPIC
Bilirubin Urine: NEGATIVE
Glucose, UA: NEGATIVE mg/dL
Ketones, ur: NEGATIVE mg/dL
Leukocytes,Ua: NEGATIVE
Nitrite: NEGATIVE
Protein, ur: NEGATIVE mg/dL
Specific Gravity, Urine: 1.008 (ref 1.005–1.030)
pH: 7 (ref 5.0–8.0)

## 2024-01-20 LAB — CBC
HCT: 33.6 % — ABNORMAL LOW (ref 36.0–46.0)
Hemoglobin: 11.1 g/dL — ABNORMAL LOW (ref 12.0–15.0)
MCH: 29.4 pg (ref 26.0–34.0)
MCHC: 33 g/dL (ref 30.0–36.0)
MCV: 88.9 fL (ref 80.0–100.0)
Platelets: 405 10*3/uL — ABNORMAL HIGH (ref 150–400)
RBC: 3.78 MIL/uL — ABNORMAL LOW (ref 3.87–5.11)
RDW: 13 % (ref 11.5–15.5)
WBC: 4.1 10*3/uL (ref 4.0–10.5)
nRBC: 0 % (ref 0.0–0.2)

## 2024-01-20 LAB — LIPASE, BLOOD: Lipase: 18 U/L (ref 11–51)

## 2024-01-20 LAB — PREGNANCY, URINE: Preg Test, Ur: NEGATIVE

## 2024-01-20 MED ORDER — HYDROMORPHONE HCL 1 MG/ML IJ SOLN
1.0000 mg | Freq: Once | INTRAMUSCULAR | Status: AC
  Administered 2024-01-20: 1 mg via INTRAVENOUS
  Filled 2024-01-20: qty 1

## 2024-01-20 MED ORDER — SODIUM CHLORIDE 0.9 % IV BOLUS
1000.0000 mL | Freq: Once | INTRAVENOUS | Status: AC
  Administered 2024-01-20: 1000 mL via INTRAVENOUS

## 2024-01-20 MED ORDER — IOHEXOL 300 MG/ML  SOLN
100.0000 mL | Freq: Once | INTRAMUSCULAR | Status: AC | PRN
Start: 2024-01-20 — End: 2024-01-20
  Administered 2024-01-20: 100 mL via INTRAVENOUS

## 2024-01-20 MED ORDER — HYDROCODONE-ACETAMINOPHEN 5-325 MG PO TABS
1.0000 | ORAL_TABLET | Freq: Four times a day (QID) | ORAL | 0 refills | Status: AC | PRN
  Filled 2024-01-20: qty 14, 4d supply, fill #0

## 2024-01-20 MED ORDER — FAMOTIDINE IN NACL 20-0.9 MG/50ML-% IV SOLN
20.0000 mg | Freq: Once | INTRAVENOUS | Status: AC
  Administered 2024-01-20: 20 mg via INTRAVENOUS
  Filled 2024-01-20: qty 50

## 2024-01-20 MED ORDER — ONDANSETRON HCL 4 MG/2ML IJ SOLN
4.0000 mg | Freq: Once | INTRAMUSCULAR | Status: AC
  Administered 2024-01-20: 4 mg via INTRAVENOUS
  Filled 2024-01-20: qty 2

## 2024-01-20 NOTE — ED Notes (Signed)
 Patient given discharge instructions. Questions were answered. Patient verbalized understanding of discharge instructions and care at home.

## 2024-01-20 NOTE — ED Provider Notes (Addendum)
 [place] EMERGENCY DEPARTMENT AT [facility] Provider Note   CSN: [id number] Arrival date & time: 01/20/24  9346     Patient presents with: Abdominal Pain and Emesis   Tracy Murphy is a 33 y.o. female.   Patient with complaint of right upper quadrant abdominal pain for a week.  Saw her GI specialist on Thursday they have ordered outpatient ultrasound.  She also had intermittent vomiting for a week with this as well.  Denies any fevers.  Temp here 98.3 pulse 97 respirations 20 blood pressure 106/66.  Oxygen saturation is 100% on room air.  Past medical history significant for headache and anxiety.  Patient has had trouble with colitis in the past.       Prior to Admission medications   Medication Sig Start Date End Date Taking? Authorizing Provider  acetaminophen  (TYLENOL ) 500 MG tablet Take 1,000 mg by mouth every 6 (six) hours as needed (For pain.).    [provider]  amoxicillin -clavulanate (AUGMENTIN ) 875-125 MG tablet Take 1 tablet by mouth every 12 (twelve) hours. 01/28/23   Rancour, Garnette, MD  dicyclomine  (BENTYL ) 20 MG tablet Take 1 tablet (20 mg total) by mouth 2 (two) times daily. 12/10/16   Olympia Gee, PA-C  HYDROcodone -acetaminophen  (NORCO/VICODIN) 5-325 MG tablet Take 1 tablet by mouth every 4 (four) hours as needed for moderate pain. 01/28/23   Rancour, Garnette, MD  omeprazole  (PRILOSEC) 40 MG capsule Take 40 mg by mouth 2 (two) times daily as needed (acid reflux).    [provider]  ondansetron  (ZOFRAN -ODT) 4 MG disintegrating tablet Take 1 tablet (4 mg total) by mouth every 8 (eight) hours as needed for nausea or vomiting. 01/28/23   Rancour, Garnette, MD  ranitidine  (ZANTAC ) 150 MG capsule Take 1 capsule (150 mg total) by mouth daily. 12/10/16   Olympia Gee, PA-C  sucralfate  (CARAFATE ) 1 g tablet Take 1 tablet (1 g total) by mouth 4 (four) times daily -  with meals and at bedtime. 12/10/16 12/17/16  Olympia Gee, PA-C  traMADol  (ULTRAM ) 50 MG  tablet Take 1 tablet (50 mg total) by mouth every 6 (six) hours as needed. Patient not taking: Reported on 09/11/2015 02/28/15   Levora Reyes SAUNDERS, MD    Allergies: Reglan  [metoclopramide ]    Review of Systems  Constitutional:  Negative for chills and fever.  HENT:  Negative for ear pain and sore throat.   Eyes:  Negative for pain and visual disturbance.  Respiratory:  Negative for cough and shortness of breath.   Cardiovascular:  Negative for chest pain and palpitations.  Gastrointestinal:  Positive for abdominal pain, nausea and vomiting.  Genitourinary:  Negative for dysuria and hematuria.  Musculoskeletal:  Negative for arthralgias and back pain.  Skin:  Negative for color change and rash.  Neurological:  Negative for seizures and syncope.  All other systems reviewed and are negative.   Updated Vital Signs BP 106/77 (BP Location: Right Arm)   Pulse 97   Temp 98.3 F (36.8 C) (Oral)   Resp 20   Ht 1.651 m (5' 5)   Wt 67.6 kg   LMP 01/20/2024   SpO2 100%   BMI 24.79 kg/m   Physical Exam Vitals and nursing note reviewed.  Constitutional:      General: She is not in acute distress.    Appearance: Normal appearance. She is well-developed.  HENT:     Head: Normocephalic and atraumatic.   Eyes:     Conjunctiva/sclera: Conjunctivae normal.  Cardiovascular:     Rate and Rhythm: Normal rate and regular rhythm.     Heart sounds: No murmur heard. Pulmonary:     Effort: Pulmonary effort is normal. No respiratory distress.     Breath sounds: Normal breath sounds.  Abdominal:     Palpations: Abdomen is soft.     Tenderness: There is abdominal tenderness. There is guarding.   Musculoskeletal:        General: No swelling.     Cervical back: Neck supple.   Skin:    General: Skin is warm and dry.     Capillary Refill: Capillary refill takes less than 2 seconds.   Neurological:     Mental Status: She is alert.   Psychiatric:        Mood and Affect: Mood normal.      (all labs ordered are listed, but only abnormal results are displayed) Labs Reviewed  LIPASE, BLOOD  COMPREHENSIVE METABOLIC PANEL WITH GFR  CBC  URINALYSIS, ROUTINE W REFLEX MICROSCOPIC  PREGNANCY, URINE    EKG: None  Radiology: No results found.   Procedures   Medications Ordered in the ED  sodium chloride  0.9 % bolus 1,000 mL (has no administration in time range)  ondansetron  (ZOFRAN ) injection 4 mg (has no administration in time range)  HYDROmorphone  (DILAUDID ) injection 1 mg (has no administration in time range)                                    Medical Decision Making Amount and/or Complexity of Data Reviewed Labs: ordered. Radiology: ordered.  Risk Prescription drug management.   Tender to palpation right upper quadrant.  Concerning may be for acute cholecystitis.  Will get ultrasound right upper quadrant will get CBC complete metabolic panel lipase.  Will give hydromorphone  and Zofran  and 1 L of normal saline.  Patient still with persistent right upper quadrant pain.  However workup negative.  Lipase 18 complete metabolic panel completely normal.  To include LFTs and renal function.  No leukocytosis white blood cell count 4.1 hemoglobin 11.1 hematocrit 33.6 platelets 405.  Urinalysis not consistent with urinary tract infection.  And pregnancy test was negative.  Ultrasound right upper quadrant without any acute findings.  And CT scan therefore was done and also no acute findings with that.  Symptoms could be consistent with significant reflux or peptic ulcer disease.  Patient is already on omeprazole  has been on that.  Her gastroenterology doctor is planning a HIDA scan.  That would be appropriate.  They also feel that she probably needs an upper endoscopy.  Will have her continue her omeprazole .  And will give her a short course of pain medicine for her to pick up here.  Final diagnoses:  Right upper quadrant abdominal pain    ED Discharge Orders      None          Geraldene Hamilton, MD 01/20/24 9257    Geraldene Hamilton, MD 01/20/24 1008

## 2024-01-20 NOTE — ED Triage Notes (Signed)
 Arrives POV with complaints of worsening right side abdominal pain (8/10) with vomiting x1 week. Patient reports seeing her GI doctor who is concerned it may be her gallbladder.

## 2024-01-20 NOTE — Discharge Instructions (Addendum)
 Follow-up with your gastroenterologist give them a call on Monday.  Would recommend still going through with the HIDA scan but feel that you probably need an upper endoscopy.  Continue your omeprazole .  Take the hydrocodone  as needed for additional pain relief.

## 2024-01-31 ENCOUNTER — Ambulatory Visit (HOSPITAL_COMMUNITY)

## 2024-01-31 ENCOUNTER — Encounter (HOSPITAL_COMMUNITY)

## 2024-01-31 ENCOUNTER — Encounter (HOSPITAL_COMMUNITY): Payer: Self-pay

## 2024-02-16 ENCOUNTER — Encounter (HOSPITAL_COMMUNITY): Payer: Self-pay

## 2024-02-16 ENCOUNTER — Encounter (HOSPITAL_COMMUNITY)
Admission: RE | Admit: 2024-02-16 | Discharge: 2024-02-16 | Disposition: A | Discharge status: Home / Self Care | Source: Ambulatory Visit | Attending: Gastroenterology | Admitting: Gastroenterology

## 2024-02-16 DIAGNOSIS — R1011 Right upper quadrant pain: Secondary | ICD-10-CM | POA: Insufficient documentation

## 2024-02-16 DIAGNOSIS — R1110 Vomiting, unspecified: Secondary | ICD-10-CM | POA: Diagnosis not present

## 2024-02-16 MED ORDER — TECHNETIUM TC 99M MEBROFENIN IV KIT
5.0000 | PACK | Freq: Once | INTRAVENOUS | Status: AC | PRN
  Administered 2024-02-16: 5.03 via INTRAVENOUS

## 2024-02-27 ENCOUNTER — Other Ambulatory Visit: Payer: Self-pay

## 2024-02-27 ENCOUNTER — Encounter (HOSPITAL_BASED_OUTPATIENT_CLINIC_OR_DEPARTMENT_OTHER): Payer: Self-pay

## 2024-02-27 ENCOUNTER — Emergency Department (HOSPITAL_BASED_OUTPATIENT_CLINIC_OR_DEPARTMENT_OTHER)
Admission: EM | Admit: 2024-02-27 | Discharge: 2024-02-27 | Disposition: A | Discharge status: Home / Self Care | Attending: Emergency Medicine | Admitting: Emergency Medicine

## 2024-02-27 DIAGNOSIS — R072 Precordial pain: Secondary | ICD-10-CM | POA: Diagnosis not present

## 2024-02-27 DIAGNOSIS — K219 Gastro-esophageal reflux disease without esophagitis: Secondary | ICD-10-CM | POA: Diagnosis not present

## 2024-02-27 DIAGNOSIS — G629 Polyneuropathy, unspecified: Secondary | ICD-10-CM | POA: Diagnosis not present

## 2024-02-27 DIAGNOSIS — R6884 Jaw pain: Secondary | ICD-10-CM | POA: Diagnosis not present

## 2024-02-27 DIAGNOSIS — R202 Paresthesia of skin: Secondary | ICD-10-CM | POA: Diagnosis not present

## 2024-02-27 DIAGNOSIS — M542 Cervicalgia: Secondary | ICD-10-CM | POA: Insufficient documentation

## 2024-02-27 DIAGNOSIS — G43809 Other migraine, not intractable, without status migrainosus: Secondary | ICD-10-CM | POA: Insufficient documentation

## 2024-02-27 DIAGNOSIS — R0789 Other chest pain: Secondary | ICD-10-CM | POA: Diagnosis not present

## 2024-02-27 LAB — BASIC METABOLIC PANEL WITH GFR
Anion gap: 8 (ref 5–15)
BUN: 5 mg/dL — ABNORMAL LOW (ref 6–20)
CO2: 27 mmol/L (ref 22–32)
Calcium: 9.5 mg/dL (ref 8.9–10.3)
Chloride: 102 mmol/L (ref 98–111)
Creatinine, Ser: 0.54 mg/dL (ref 0.44–1.00)
GFR, Estimated: >60 mL/min (ref >60)
Glucose, Bld: 111 mg/dL — ABNORMAL HIGH (ref 70–99)
Potassium: 4 mmol/L (ref 3.5–5.1)
Sodium: 137 mmol/L (ref 135–145)

## 2024-02-27 LAB — CBC
HCT: 33 % — ABNORMAL LOW (ref 36.0–46.0)
Hemoglobin: 10.9 g/dL — ABNORMAL LOW (ref 12.0–15.0)
MCH: 28.5 pg (ref 26.0–34.0)
MCHC: 33 g/dL (ref 30.0–36.0)
MCV: 86.2 fL (ref 80.0–100.0)
Platelets: 369 K/uL (ref 150–400)
RBC: 3.83 MIL/uL — ABNORMAL LOW (ref 3.87–5.11)
RDW: 13.2 % (ref 11.5–15.5)
WBC: 5.9 K/uL (ref 4.0–10.5)
nRBC: 0 % (ref 0.0–0.2)

## 2024-02-27 LAB — MAGNESIUM: Magnesium: 2 mg/dL (ref 1.7–2.4)

## 2024-02-27 LAB — CBG MONITORING, ED: Glucose-Capillary: 85 mg/dL (ref 70–99)

## 2024-02-27 LAB — TROPONIN T, HIGH SENSITIVITY: Troponin T High Sensitivity: <15 ng/L (ref <19)

## 2024-02-27 MED ORDER — GABAPENTIN 100 MG PO CAPS: 100.0000 mg | ORAL_CAPSULE | Freq: Three times a day (TID) | ORAL | 0 refills | Status: AC | PRN

## 2024-02-27 MED ORDER — ONDANSETRON 4 MG PO TBDP
4.0000 mg | ORAL_TABLET | Freq: Once | ORAL | Status: AC
  Administered 2024-02-27: 4 mg via ORAL
  Filled 2024-02-27: qty 1

## 2024-02-27 NOTE — ED Notes (Signed)
 Ambulatory to restroom

## 2024-02-27 NOTE — ED Provider Notes (Signed)
 [place] EMERGENCY DEPARTMENT AT [facility] Provider Note   CSN: [id number] Arrival date & time: 02/27/24  1711     Patient presents with: Numbness   Tracy Murphy is a 33 y.o. female here with several concerns.  Patient reports having substernal chest pain, left arm tingling, left neck and left jaw pain.  She had a headache this morning that resolved.  She's had changes to her smell for the past 2-3 days. She says she feels extremely fatigued.  She says her tongue feels heavy  She gets migraines 3 times a year roughly, but feels this is different. No current headache   HPI     Prior to Admission medications   Medication Sig Start Date End Date Taking? Authorizing Provider  gabapentin  (NEURONTIN ) 100 MG capsule Take 1 capsule (100 mg total) by mouth 3 (three) times daily as needed for up to 30 doses. 02/27/24  Yes Cottie Donnice PARAS, MD  acetaminophen  (TYLENOL ) 500 MG tablet Take 1,000 mg by mouth every 6 (six) hours as needed (For pain.).    [provider]  amoxicillin -clavulanate (AUGMENTIN ) 875-125 MG tablet Take 1 tablet by mouth every 12 (twelve) hours. 01/28/23   Rancour, Garnette, MD  dicyclomine  (BENTYL ) 20 MG tablet Take 1 tablet (20 mg total) by mouth 2 (two) times daily. 12/10/16   Olympia Gee, PA-C  HYDROcodone -acetaminophen  (NORCO/VICODIN) 5-325 MG tablet Take 1 tablet by mouth every 4 (four) hours as needed for moderate pain. 01/28/23   Rancour, Garnette, MD  HYDROcodone -acetaminophen  (NORCO/VICODIN) 5-325 MG tablet Take 1 tablet by mouth every 6 (six) hours as needed for moderate pain (pain score 4-6). 01/20/24   Zackowski, Scott, MD  omeprazole  (PRILOSEC) 40 MG capsule Take 40 mg by mouth 2 (two) times daily as needed (acid reflux).    [provider]  ondansetron  (ZOFRAN -ODT) 4 MG disintegrating tablet Take 1 tablet (4 mg total) by mouth every 8 (eight) hours as needed for nausea or vomiting. 01/28/23   Rancour, Garnette, MD  ranitidine  (ZANTAC ) 150  MG capsule Take 1 capsule (150 mg total) by mouth daily. 12/10/16   Olympia Gee, PA-C  sucralfate  (CARAFATE ) 1 g tablet Take 1 tablet (1 g total) by mouth 4 (four) times daily -  with meals and at bedtime. 12/10/16 12/17/16  Olympia Gee, PA-C  traMADol  (ULTRAM ) 50 MG tablet Take 1 tablet (50 mg total) by mouth every 6 (six) hours as needed. Patient not taking: Reported on 09/11/2015 02/28/15   Levora Reyes SAUNDERS, MD    Allergies: Reglan  [metoclopramide ]    Review of Systems  Updated Vital Signs BP 109/76   Pulse 82   Temp 99.5 F (37.5 C)   Resp 14   LMP 02/13/2024 (Exact Date)   SpO2 98%   Physical Exam Constitutional:      General: She is not in acute distress. HENT:     Head: Normocephalic and atraumatic.  Eyes:     Conjunctiva/sclera: Conjunctivae normal.     Pupils: Pupils are equal, round, and reactive to light.  Cardiovascular:     Rate and Rhythm: Normal rate and regular rhythm.  Pulmonary:     Effort: Pulmonary effort is normal. No respiratory distress.  Abdominal:     General: There is no distension.     Tenderness: There is no abdominal tenderness.  Musculoskeletal:     Comments: Trigger point tenderness along chest wall and suprascapular/deltoid region  Skin:    General: Skin is warm and dry.  Neurological:  General: No focal deficit present.     Mental Status: She is alert. Mental status is at baseline.  Psychiatric:        Mood and Affect: Mood normal.        Behavior: Behavior normal.     (all labs ordered are listed, but only abnormal results are displayed) Labs Reviewed  BASIC METABOLIC PANEL WITH GFR - Abnormal; Notable for the following components:      Result Value   Glucose, Bld 111 (*)    BUN 5 (*)    All other components within normal limits  CBC - Abnormal; Notable for the following components:   RBC 3.83 (*)    Hemoglobin 10.9 (*)    HCT 33.0 (*)    All other components within normal limits  MAGNESIUM  CBG MONITORING, ED  TROPONIN T,  HIGH SENSITIVITY    EKG: EKG Interpretation Date/Time:  Tuesday February 27 2024 18:21:08 EDT Ventricular Rate:  84 PR Interval:  140 QRS Duration:  78 QT Interval:  365 QTC Calculation: 432 R Axis:   61  Text Interpretation: Sinus rhythm Consider right atrial enlargement Confirmed by Cottie Cough [phone]) on 02/27/2024 7:49:26 PM  Radiology: No results found.   Procedures   Medications Ordered in the ED  ondansetron  (ZOFRAN -ODT) disintegrating tablet 4 mg (4 mg Oral Given 02/27/24 1832)    Clinical Course as of 02/27/24 2039  Tue Feb 27, 2024  2038 Pt made aware of chronic anemia; hgb stable from recent levels - likely related to her heavy menses, but this is unlikely a cause of her symptoms [MT]    Clinical Course User Index [MT] Shaylea Ucci, Cough PARAS, MD                                 Medical Decision Making Amount and/or Complexity of Data Reviewed Labs: ordered. ECG/medicine tests: ordered.  Risk Prescription drug management.   Constellation of symptoms  - trigger point tenderness on exam  I wonder about fibromyalgia  This does not correlate with a stroke or ACS.  No risk factors for these conditions.  No family hx of MS, but given the evolving complaints she has had in the past, I think it may be reasonable to refer her to neurologia for consideration of MS vs complex migraines.  Zofran  given for nausea here  Labs and ecg personally reviewed, with no emergent findings. Trop negative.  Chronic stable mild anemia.  She is stable for discharge     Final diagnoses:  Neuropathy  Other migraine without status migrainosus, not intractable    ED Discharge Orders          Ordered    gabapentin  (NEURONTIN ) 100 MG capsule  3 times daily PRN        02/27/24 2038    Ambulatory referral to Neurology       Comments: An appointment is requested in approximately: 2 weeks Potential MS vs complex migraine evaluation   02/27/24 2038               Cottie Cough PARAS, MD 02/27/24 2039

## 2024-02-27 NOTE — ED Triage Notes (Signed)
 Pt c/o L sided facial numbness, L arm pain, advises that HR is all over the place. Has been going on for 3 days, I was really tired, then today everything else happened.

## 2024-04-05 ENCOUNTER — Ambulatory Visit (INDEPENDENT_AMBULATORY_CARE_PROVIDER_SITE_OTHER): Admitting: Neurology

## 2024-04-05 ENCOUNTER — Encounter: Payer: Self-pay | Admitting: Neurology

## 2024-04-05 VITALS — BP 125/85 | HR 64 | Ht 63.0 in | Wt 153.5 lb

## 2024-04-05 DIAGNOSIS — G43709 Chronic migraine without aura, not intractable, without status migrainosus: Secondary | ICD-10-CM

## 2024-04-05 DIAGNOSIS — D509 Iron deficiency anemia, unspecified: Secondary | ICD-10-CM | POA: Diagnosis not present

## 2024-04-05 DIAGNOSIS — R52 Pain, unspecified: Secondary | ICD-10-CM

## 2024-04-05 DIAGNOSIS — E538 Deficiency of other specified B group vitamins: Secondary | ICD-10-CM | POA: Diagnosis not present

## 2024-04-05 DIAGNOSIS — E079 Disorder of thyroid, unspecified: Secondary | ICD-10-CM | POA: Diagnosis not present

## 2024-04-05 DIAGNOSIS — R799 Abnormal finding of blood chemistry, unspecified: Secondary | ICD-10-CM | POA: Diagnosis not present

## 2024-04-05 DIAGNOSIS — R7989 Other specified abnormal findings of blood chemistry: Secondary | ICD-10-CM | POA: Diagnosis not present

## 2024-04-05 DIAGNOSIS — Z1329 Encounter for screening for other suspected endocrine disorder: Secondary | ICD-10-CM | POA: Diagnosis not present

## 2024-04-05 MED ORDER — DULOXETINE HCL 30 MG PO CPEP: 30.0000 mg | ORAL_CAPSULE | Freq: Every day | ORAL | 6 refills | Status: AC

## 2024-04-05 MED ORDER — RIZATRIPTAN BENZOATE 10 MG PO TBDP: 10.0000 mg | ORAL_TABLET | ORAL | 6 refills | Status: AC | PRN

## 2024-04-05 NOTE — Progress Notes (Signed)
 Chief Complaint  Patient presents with   RM14    Pt is here Alone. Pt states she is still having migraines. Pt states that she has numbness on her left side. Pt states she has spasms in her hands and arms. Pt states she has a tingling sensation in her head. Pt states that her head will throb sometimes.       ASSESSMENT AND PLAN  Tracy Murphy is a 33 y.o. female   Chronic migraine headaches Body achy pain Anemia  Laboratory evaluation to rule out inflammatory markers, B12 and iron panels  Will call her report  Significant nausea with her migraine, will try Maxalt  dissolvable as needed  Cymbalta  30 mg daily as preventive medication, hope will benefit her body achy pain as well   We will call her lab result,    DIAGNOSTIC DATA (LABS, IMAGING, TESTING) - I reviewed patient records, labs, notes, testing and imaging myself where available.   MEDICAL HISTORY:  Tracy Murphy is a 33 year old female, seen in request by her primary care from [facility] Dr. Cleotilde, [place], for evaluation of migraine headache, initial evaluation April 05, 2024  History is obtained from the patient and review of electronic medical records. I personally reviewed pertinent available imaging films in PACS.   PMHx of  Chronic migraine Body achy pain   She was treated at emergency room on February 27, 2024 for severe left parietal region headache, with associated transient left-sided numbness involving her face, arm during intense headache, lasting few hours, eventually resolved,  She had long history of migraine headache, described typical migraine as lateralized severe pounding headache with light, noise sensitivity, nauseous, once a month, she would have this horrible headache, it was so painful, she has to put off everything, was given Imitrex, was helpful,  She complains of once or twice every week holocranial milder degree of pressure headaches, body achy pain, for that reason she is taking Tylenol  1000 mg  twice a day, used to drink large quantity of caffeine , now only 1 cup/day, Labs showed anemia hemoglobin of 10.9, normal BMP  She complains of body achy pain, stomach issues, on omeprazole   PHYSICAL EXAM:   Vitals:   04/05/24 1119  BP: 125/85  Pulse: 64  SpO2: 99%  Weight: 153 lb 8 oz (69.6 kg)  Height: 5' 3 (1.6 m)    Body mass index is 27.19 kg/m.  PHYSICAL EXAMNIATION:  Gen: NAD, conversant, well nourised, well groomed                     Cardiovascular: Regular rate rhythm, no peripheral edema, warm, nontender. Eyes: Conjunctivae clear without exudates or hemorrhage Neck: Supple, no carotid bruits. Pulmonary: Clear to auscultation bilaterally   NEUROLOGICAL EXAM:  MENTAL STATUS: Speech/cognition: Awake, alert, oriented to history taking and casual conversation CRANIAL NERVES: CN II: Visual fields are full to confrontation. Pupils are round equal and briskly reactive to light.  Funduscopy were normal CN III, IV, VI: extraocular movement are normal. No ptosis. CN V: Facial sensation is intact to light touch CN VII: Face is symmetric with normal eye closure  CN VIII: Hearing is normal to causal conversation. CN IX, X: Phonation is normal. CN XI: Head turning and shoulder shrug are intact  MOTOR: There is no pronator drift of out-stretched arms. Muscle bulk and tone are normal. Muscle strength is normal.  REFLEXES: Reflexes are 2+ and symmetric at the biceps, triceps, knees, and ankles. Plantar responses are flexor.  SENSORY: Intact to light touch, pinprick and vibratory sensation are intact in fingers and toes.  COORDINATION: There is no trunk or limb dysmetria noted.  GAIT/STANCE: Posture is normal. Gait is steady with normal steps, base, arm swing, and turning. Heel and toe walking are normal. Tandem gait is normal.  Romberg is absent.  REVIEW OF SYSTEMS:  Full 14 system review of systems performed and notable only for as above All other review of  systems were negative.   ALLERGIES: Allergies  Allergen Reactions   Reglan  [Metoclopramide ] Anxiety and Other (See Comments)    DYSTONIA    HOME MEDICATIONS: Current Outpatient Medications  Medication Sig Dispense Refill   acetaminophen  (TYLENOL ) 500 MG tablet Take 1,000 mg by mouth every 6 (six) hours as needed (For pain.).     omeprazole  (PRILOSEC) 40 MG capsule Take 40 mg by mouth 2 (two) times daily as needed (acid reflux).     ondansetron  (ZOFRAN -ODT) 4 MG disintegrating tablet Take 1 tablet (4 mg total) by mouth every 8 (eight) hours as needed for nausea or vomiting. 20 tablet 0   ranitidine  (ZANTAC ) 150 MG capsule Take 1 capsule (150 mg total) by mouth daily. 30 capsule 0   sucralfate  (CARAFATE ) 1 g tablet Take 1 tablet (1 g total) by mouth 4 (four) times daily -  with meals and at bedtime. 28 tablet 0   SUMAtriptan (IMITREX) 50 MG tablet 1 tablet at least 2 hours between doses Orally Twice a day as needed for 30 days     amoxicillin -clavulanate (AUGMENTIN ) 875-125 MG tablet Take 1 tablet by mouth every 12 (twelve) hours. (Patient not taking: Reported on 04/05/2024) 14 tablet 0   dicyclomine  (BENTYL ) 20 MG tablet Take 1 tablet (20 mg total) by mouth 2 (two) times daily. (Patient not taking: Reported on 04/05/2024) 20 tablet 0   gabapentin  (NEURONTIN ) 100 MG capsule Take 1 capsule (100 mg total) by mouth 3 (three) times daily as needed for up to 30 doses. (Patient not taking: Reported on 04/05/2024) 30 capsule 0   HYDROcodone -acetaminophen  (NORCO/VICODIN) 5-325 MG tablet Take 1 tablet by mouth every 4 (four) hours as needed for moderate pain. (Patient not taking: Reported on 04/05/2024) 10 tablet 0   HYDROcodone -acetaminophen  (NORCO/VICODIN) 5-325 MG tablet Take 1 tablet by mouth every 6 (six) hours as needed for moderate pain (pain score 4-6). (Patient not taking: Reported on 04/05/2024) 14 tablet 0   traMADol  (ULTRAM ) 50 MG tablet Take 1 tablet (50 mg total) by mouth every 6 (six) hours as  needed. (Patient not taking: Reported on 04/05/2024) 10 tablet 0   No current facility-administered medications for this visit.    PAST MEDICAL HISTORY: Past Medical History:  Diagnosis Date   Anxiety    Headache     PAST SURGICAL HISTORY: Past Surgical History:  Procedure Laterality Date   WISDOM TOOTH EXTRACTION      FAMILY HISTORY: Family History  Problem Relation Age of Onset   Diabetes Father    Heart disease Father    Hypertension Father    Migraines Father    Hyperlipidemia Father     SOCIAL HISTORY: Social History   Socioeconomic History   Marital status: Single    Spouse name: n/a   Number of children: 0   Years of education: Not on file   Highest education level: Not on file  Occupational History   Occupation: Student    Comment: [employer] major  Tobacco Use   Smoking status: Never   Smokeless tobacco:  Never  Substance and Sexual Activity   Alcohol use: No   Drug use: No   Sexual activity: Never  Other Topics Concern   Not on file  Social History Narrative   Lives with her parents.   Born in the [place]  to [place] parents.  Lived in the [place] x 7 years, and returned [place] the [place] age 57.   Social Drivers of Corporate investment banker Strain: Not on file  Food Insecurity: Not on file  Transportation Needs: Not on file  Physical Activity: Not on file  Stress: Not on file  Social Connections: Not on file  Intimate Partner Violence: Not on file      Modena Callander, M.D. Ph.D.  [facility] [address] [place], [place] [address] Ph: [phone] Fax: [phone]  CC:  Cottie Donnice PARAS, MD [address] [place],  [place] [address]  Cleotilde Planas, MD

## 2024-04-05 NOTE — Patient Instructions (Signed)
 Meds ordered this encounter  Medications   DULoxetine  (CYMBALTA ) 30 MG capsule    Sig: Take 1 capsule (30 mg total) by mouth daily.    Dispense:  30 capsule    Refill:  6   rizatriptan  (MAXALT -MLT) 10 MG disintegrating tablet    Sig: Take 1 tablet (10 mg total) by mouth as needed. May repeat in 2 hours if needed    Dispense:  12 tablet    Refill:  6

## 2024-04-06 LAB — SEDIMENTATION RATE: Sed Rate: 25 mm/h (ref 0–32)

## 2024-04-06 LAB — VITAMIN D 25 HYDROXY (VIT D DEFICIENCY, FRACTURES): Vit D, 25-Hydroxy: 25.2 ng/mL — ABNORMAL LOW (ref 30.0–100.0)

## 2024-04-06 LAB — ANA W/REFLEX IF POSITIVE: Anti Nuclear Antibody (ANA): NEGATIVE

## 2024-04-06 LAB — C-REACTIVE PROTEIN: CRP: 2 mg/L (ref 0–10)

## 2024-04-06 LAB — RPR: RPR Ser Ql: NONREACTIVE

## 2024-04-06 LAB — IRON AND TIBC
Iron Saturation: 7 % — CL (ref 15–55)
Iron: 23 ug/dL — ABNORMAL LOW (ref 27–159)
Total Iron Binding Capacity: 350 ug/dL (ref 250–450)
UIBC: 327 ug/dL (ref 131–425)

## 2024-04-06 LAB — VITAMIN B12: Vitamin B-12: 598 pg/mL (ref 232–1245)

## 2024-04-06 LAB — TSH: TSH: 0.851 u[IU]/mL (ref 0.450–4.500)

## 2024-04-06 LAB — FOLATE: Folate: 10.3 ng/mL (ref >3.0)

## 2024-04-06 LAB — CK: Total CK: 63 U/L (ref 32–182)

## 2024-04-08 ENCOUNTER — Ambulatory Visit: Payer: Self-pay | Admitting: Neurology

## 2024-05-14 DIAGNOSIS — E559 Vitamin D deficiency, unspecified: Secondary | ICD-10-CM | POA: Diagnosis not present

## 2024-05-14 DIAGNOSIS — Z0000 Encounter for general adult medical examination without abnormal findings: Secondary | ICD-10-CM | POA: Diagnosis not present

## 2024-05-14 DIAGNOSIS — R1030 Lower abdominal pain, unspecified: Secondary | ICD-10-CM | POA: Diagnosis not present

## 2024-05-14 DIAGNOSIS — E611 Iron deficiency: Secondary | ICD-10-CM | POA: Diagnosis not present

## 2024-05-14 DIAGNOSIS — E7800 Pure hypercholesterolemia, unspecified: Secondary | ICD-10-CM | POA: Diagnosis not present

## 2024-05-14 DIAGNOSIS — Z6827 Body mass index (BMI) 27.0-27.9, adult: Secondary | ICD-10-CM | POA: Diagnosis not present

## 2024-09-13 ENCOUNTER — Inpatient Hospital Stay

## 2024-09-13 ENCOUNTER — Inpatient Hospital Stay: Admitting: Oncology

## 2024-09-20 ENCOUNTER — Inpatient Hospital Stay

## 2024-09-20 ENCOUNTER — Inpatient Hospital Stay: Admitting: Physician Assistant
